# Patient Record
Sex: Female | Born: 1945 | Race: White | Hispanic: No | Marital: Married | Smoking: Never smoker
Health system: Southern US, Community
[De-identification: ages and names within clinical notes are randomized; demographics above are authoritative.]

## PROBLEM LIST (undated history)

## (undated) DIAGNOSIS — T753XXA Motion sickness, initial encounter: Secondary | ICD-10-CM

## (undated) DIAGNOSIS — R42 Dizziness and giddiness: Secondary | ICD-10-CM

## (undated) HISTORY — PX: WISDOM TOOTH EXTRACTION: SHX21

---

## 2018-04-06 LAB — HM HEPATITIS C SCREENING LAB: HM Hepatitis Screen: NEGATIVE

## 2019-10-12 ENCOUNTER — Telehealth: Payer: Self-pay | Admitting: Family Medicine

## 2019-10-12 NOTE — Telephone Encounter (Signed)
Patient's husband is calling to report that the patient had the Pfizer Vaccine 04/24/19, 05/15/19 Please advise CB- [phone]

## 2019-11-16 ENCOUNTER — Ambulatory Visit (INDEPENDENT_AMBULATORY_CARE_PROVIDER_SITE_OTHER): Payer: Medicare Other | Admitting: Family Medicine

## 2019-11-16 ENCOUNTER — Other Ambulatory Visit: Payer: Self-pay

## 2019-11-16 ENCOUNTER — Encounter: Payer: Self-pay | Admitting: Family Medicine

## 2019-11-16 VITALS — BP 136/72 | HR 65 | Temp 97.7°F | Resp 18 | Ht 62.5 in | Wt 151.8 lb

## 2019-11-16 DIAGNOSIS — Z1231 Encounter for screening mammogram for malignant neoplasm of breast: Secondary | ICD-10-CM | POA: Diagnosis not present

## 2019-11-16 DIAGNOSIS — Z1211 Encounter for screening for malignant neoplasm of colon: Secondary | ICD-10-CM | POA: Insufficient documentation

## 2019-11-16 DIAGNOSIS — E782 Mixed hyperlipidemia: Secondary | ICD-10-CM | POA: Insufficient documentation

## 2019-11-16 DIAGNOSIS — Z780 Asymptomatic menopausal state: Secondary | ICD-10-CM | POA: Insufficient documentation

## 2019-11-16 DIAGNOSIS — Z7689 Persons encountering health services in other specified circumstances: Secondary | ICD-10-CM | POA: Insufficient documentation

## 2019-11-16 DIAGNOSIS — Z23 Encounter for immunization: Secondary | ICD-10-CM | POA: Insufficient documentation

## 2019-11-16 DIAGNOSIS — Z13820 Encounter for screening for osteoporosis: Secondary | ICD-10-CM | POA: Insufficient documentation

## 2019-11-16 DIAGNOSIS — Z79899 Other long term (current) drug therapy: Secondary | ICD-10-CM

## 2019-11-16 DIAGNOSIS — Z0000 Encounter for general adult medical examination without abnormal findings: Secondary | ICD-10-CM

## 2019-11-16 MED ORDER — ATORVASTATIN CALCIUM 10 MG PO TABS: 10.0000 mg | ORAL_TABLET | Freq: Every day | ORAL | 3 refills | Status: DC

## 2019-11-16 NOTE — Assessment & Plan Note (Signed)
Pt postmenopausal w/ history of prior DEXA scan in 2017.  No report available for review.  Plan: 1. Obtain DG bone density.

## 2019-11-16 NOTE — Assessment & Plan Note (Signed)
See screening for osteoporosis A/P

## 2019-11-16 NOTE — Assessment & Plan Note (Signed)
Status unknown.  Recheck labs.  Continue meds without changes today.  Refills provided. Followup after labs.  

## 2019-11-16 NOTE — Assessment & Plan Note (Signed)
Pt last mammogram reported in 2017, no report available for review.  Plan: 1. Screening mammogram order placed.  Pt will call to schedule appointment.  Information given.

## 2019-11-16 NOTE — Patient Instructions (Signed)
Have your labs drawn and we will contact you with the results.  We have given you your influenza immunization today in clinic.  I have sent in a refill on your atorvastatin to your pharmacy on file.  For Mammogram screening for breast cancer and DEXA Scan (Bone mineral density) screening for osteoporosis  Call the Imaging Center below anytime to schedule your own appointment now that order has been placed.  [facility] [address] [place], [place] [address] Phone: [phone]  [facility] [address] [place], [place] [address] Phone: [phone]  The cologuard testing will be mailed to your home.  If you have any questions once you receive this, there should be a customer service phone number along with the testing that you can contact for questions/concerns/instructions.  We will plan to see you back in 6 months for annual physical  You will receive a survey after today's visit either digitally by e-mail or paper by [employer]. Your experiences and feedback matter to [place].  Please respond so we know how we are doing as we provide care for you.  Call us with any questions/concerns/needs.  It is my goal to be available to you for your health concerns.  Thanks for choosing me to be a partner in your healthcare needs!  Charlaine Dalton, FNP-C Family Nurse Practitioner [facility] Phone: [phone]

## 2019-11-16 NOTE — Progress Notes (Signed)
Subjective:    Patient ID: Rhonda Lamb, female    DOB: 10-Mar-1945, 74 y.o.   MRN: [id number]  Rhonda Lamb is a 74 y.o. female presenting on 11/16/2019 for Establish Care (pt recently located to the area. Pt requesting a refill on Atorvastatin)   HPI  Previous PCP was at [facility].  Records will not be requested, as are available through Care Everywhere.  Past medical, family, and surgical history reviewed w/ pt.  Has acute concerns today for needing medication refills, would like to have baseline labs drawn, needing screening for mammogram, bone density and colon cancer screening.  Depression screen PHQ 2/9 11/16/2019  Decreased Interest 0  Down, Depressed, Hopeless 0  PHQ - 2 Score 0    Social History   Tobacco Use  . Smoking status: Never Smoker  . Smokeless tobacco: Never Used  Vaping Use  . Vaping Use: Never used  Substance Use Topics  . Alcohol use: Never  . Drug use: Never    Review of Systems  Constitutional: Negative.   HENT: Negative.   Eyes: Negative.   Respiratory: Negative.   Cardiovascular: Negative.   Gastrointestinal: Negative.   Endocrine: Negative.   Genitourinary: Negative.   Musculoskeletal: Negative.   Skin: Negative.   Allergic/Immunologic: Negative.   Neurological: Negative.   Hematological: Negative.   Psychiatric/Behavioral: Negative.    Per HPI unless specifically indicated above     Objective:    BP 136/72 (BP Location: Left Arm, Patient Position: Sitting, Cuff Size: Normal)   Pulse 65   Temp 97.7 F (36.5 C) (Oral)   Resp 18   Ht 5' 2.5" (1.588 m)   Wt 151 lb 12.8 oz (68.9 kg)   SpO2 100%   BMI 27.32 kg/m   Wt Readings from Last 3 Encounters:  11/16/19 151 lb 12.8 oz (68.9 kg)    Physical Exam Vitals reviewed.  Constitutional:      General: She is not in acute distress.    Appearance: Normal appearance. She is well-developed, well-groomed and overweight. She is not ill-appearing or toxic-appearing.  HENT:     Head:  Normocephalic and atraumatic.     Nose:     Comments: Lesia Sago is in place, covering mouth and nose. Eyes:     General: Lids are normal. Vision grossly intact.        Right eye: No discharge.        Left eye: No discharge.     Extraocular Movements: Extraocular movements intact.     Conjunctiva/sclera: Conjunctivae normal.     Pupils: Pupils are equal, round, and reactive to light.  Cardiovascular:     Rate and Rhythm: Normal rate and regular rhythm.     Pulses: Normal pulses.     Heart sounds: Normal heart sounds. No murmur heard.  No friction rub. No gallop.   Pulmonary:     Effort: Pulmonary effort is normal. No respiratory distress.     Breath sounds: Normal breath sounds.  Skin:    General: Skin is warm and dry.     Capillary Refill: Capillary refill takes less than 2 seconds.  Neurological:     General: No focal deficit present.     Mental Status: She is alert and oriented to person, place, and time.  Psychiatric:        Attention and Perception: Attention and perception normal.        Mood and Affect: Mood and affect normal.        Speech:  Speech normal.        Behavior: Behavior normal. Behavior is cooperative.        Thought Content: Thought content normal.        Cognition and Memory: Cognition and memory normal.        Judgment: Judgment normal.    Results for orders placed or performed in visit on 11/16/19  HM HEPATITIS C SCREENING LAB  Result Value Ref Range   HM Hepatitis Screen Negative-Validated       Assessment & Plan:   Problem List Items Addressed This Visit      Other   Encounter to establish care with new doctor - Primary    New patient establishment to [facility] for primary care services.  Interested in having labs drawn today for baseline at our clinic along with discussion regarding preventative care screenings.      Post-menopausal    See screening for osteoporosis A/P      Relevant Orders   DG Bone Density   Mixed hyperlipidemia    Status  unknown.  Recheck labs.  Continue meds without changes today.  Refills provided. Followup after labs.       Relevant Medications   atorvastatin (LIPITOR) 10 MG tablet   Other Relevant Orders   CBC with Differential   COMPLETE METABOLIC PANEL WITH GFR   Lipid Profile   Screening for osteoporosis    Pt postmenopausal w/ history of prior DEXA scan in 2017.  No report available for review.  Plan: 1. Obtain DG bone density.         Relevant Orders   DG Bone Density   Encounter for screening mammogram for malignant neoplasm of breast    Pt last mammogram reported in 2017, no report available for review.  Plan: 1. Screening mammogram order placed.  Pt will call to schedule appointment.  Information given.       Relevant Orders   MM 3D SCREEN BREAST BILATERAL   Colon cancer screening    Pt requiring colon cancer screening.  Denies family history of colon cancer.  Reports most recent cologuard screening was completed in 2018 and was negative.  Plan: - Discussed timing for initiation of colon cancer screening ACS vs USPSTF guidelines - Mutual decision making discussion for options of colonoscopy vs cologuard.  Pt prefers cologuard. - Ordered Cologuard today      Relevant Orders   Cologuard   Needs flu shot    Pt > age 59.  Needs annual influenza vaccine.  VIS provided.  Plan: 1. Administer high dose fluzone today.       Relevant Orders   Flu Vaccine QUAD High Dose(Fluad)    Other Visit Diagnoses    Long-term use of high-risk medication       Relevant Orders   DG Bone Density   CBC with Differential   COMPLETE METABOLIC PANEL WITH GFR   TSH + free T4   Routine medical exam          Meds ordered this encounter  Medications  . atorvastatin (LIPITOR) 10 MG tablet    Sig: Take 1 tablet (10 mg total) by mouth daily.    Dispense:  90 tablet    Refill:  3   Follow up plan: Return in about 6 months (around 05/15/2020) for CPE.   Charlaine Dalton, FNP Family  Nurse Practitioner [facility] 11/16/2019, 12:00 PM

## 2019-11-16 NOTE — Assessment & Plan Note (Signed)
Pt > age 74.  Needs annual influenza vaccine.  VIS provided.  Plan: 1. Administer high dose fluzone today.  

## 2019-11-16 NOTE — Assessment & Plan Note (Signed)
Pt requiring colon cancer screening.  Denies family history of colon cancer.  Reports most recent cologuard screening was completed in 2018 and was negative.  Plan: - Discussed timing for initiation of colon cancer screening ACS vs USPSTF guidelines - Mutual decision making discussion for options of colonoscopy vs cologuard.  Pt prefers cologuard. - Ordered Cologuard today

## 2019-11-16 NOTE — Assessment & Plan Note (Signed)
New patient establishment to [facility] for primary care services.  Interested in having labs drawn today for baseline at our clinic along with discussion regarding preventative care screenings.

## 2019-11-17 LAB — LIPID PANEL
Cholesterol: 155 mg/dL (ref <200)
HDL: 70 mg/dL (ref >=50)
LDL Cholesterol (Calc): 66 mg/dL (calc)
Non-HDL Cholesterol (Calc): 85 mg/dL (calc) (ref <130)
Total CHOL/HDL Ratio: 2.2 (calc) (ref <5.0)
Triglycerides: 104 mg/dL (ref <150)

## 2019-11-17 LAB — COMPLETE METABOLIC PANEL WITH GFR
AG Ratio: 1.8 (calc) (ref 1.0–2.5)
ALT: 11 U/L (ref 6–29)
AST: 20 U/L (ref 10–35)
Albumin: 4.4 g/dL (ref 3.6–5.1)
Alkaline phosphatase (APISO): 66 U/L (ref 37–153)
BUN: 11 mg/dL (ref 7–25)
CO2: 26 mmol/L (ref 20–32)
Calcium: 8.8 mg/dL (ref 8.6–10.4)
Chloride: 104 mmol/L (ref 98–110)
Creat: 0.82 mg/dL (ref 0.60–0.93)
GFR, Est African American: 83 mL/min/{1.73_m2} (ref >=60)
GFR, Est Non African American: 72 mL/min/{1.73_m2} (ref >=60)
Globulin: 2.4 g/dL (calc) (ref 1.9–3.7)
Glucose, Bld: 80 mg/dL (ref 65–99)
Potassium: 4.4 mmol/L (ref 3.5–5.3)
Sodium: 139 mmol/L (ref 135–146)
Total Bilirubin: 0.9 mg/dL (ref 0.2–1.2)
Total Protein: 6.8 g/dL (ref 6.1–8.1)

## 2019-11-17 LAB — CBC WITH DIFFERENTIAL/PLATELET
Absolute Monocytes: 301 cells/uL (ref 200–950)
Basophils Absolute: 41 cells/uL (ref 0–200)
Basophils Relative: 0.8 %
Eosinophils Absolute: 112 cells/uL (ref 15–500)
Eosinophils Relative: 2.2 %
HCT: 45.7 % — ABNORMAL HIGH (ref 35.0–45.0)
Hemoglobin: 15.2 g/dL (ref 11.7–15.5)
Lymphs Abs: 2091 cells/uL (ref 850–3900)
MCH: 30.5 pg (ref 27.0–33.0)
MCHC: 33.3 g/dL (ref 32.0–36.0)
MCV: 91.6 fL (ref 80.0–100.0)
MPV: 11.6 fL (ref 7.5–12.5)
Monocytes Relative: 5.9 %
Neutro Abs: 2555 cells/uL (ref 1500–7800)
Neutrophils Relative %: 50.1 %
Platelets: 180 10*3/uL (ref 140–400)
RBC: 4.99 10*6/uL (ref 3.80–5.10)
RDW: 12.2 % (ref 11.0–15.0)
Total Lymphocyte: 41 %
WBC: 5.1 10*3/uL (ref 3.8–10.8)

## 2019-11-17 LAB — TSH+FREE T4: TSH W/REFLEX TO FT4: 1.45 mIU/L (ref 0.40–4.50)

## 2019-12-07 ENCOUNTER — Ambulatory Visit
Admission: RE | Admit: 2019-12-07 | Discharge: 2019-12-07 | Disposition: A | Discharge status: Home / Self Care | Payer: Medicare Other | Source: Ambulatory Visit | Attending: Family Medicine | Admitting: Family Medicine

## 2019-12-07 ENCOUNTER — Other Ambulatory Visit: Payer: Self-pay

## 2019-12-07 ENCOUNTER — Encounter: Payer: Self-pay | Admitting: Family Medicine

## 2019-12-07 ENCOUNTER — Ambulatory Visit (INDEPENDENT_AMBULATORY_CARE_PROVIDER_SITE_OTHER): Payer: Medicare Other | Admitting: Pharmacist

## 2019-12-07 ENCOUNTER — Other Ambulatory Visit: Payer: Self-pay | Admitting: Family Medicine

## 2019-12-07 DIAGNOSIS — M810 Age-related osteoporosis without current pathological fracture: Secondary | ICD-10-CM | POA: Insufficient documentation

## 2019-12-07 DIAGNOSIS — Z780 Asymptomatic menopausal state: Secondary | ICD-10-CM | POA: Insufficient documentation

## 2019-12-07 DIAGNOSIS — Z1231 Encounter for screening mammogram for malignant neoplasm of breast: Secondary | ICD-10-CM | POA: Insufficient documentation

## 2019-12-07 DIAGNOSIS — M816 Localized osteoporosis [Lequesne]: Secondary | ICD-10-CM

## 2019-12-07 DIAGNOSIS — Z7251 High risk heterosexual behavior: Secondary | ICD-10-CM

## 2019-12-07 DIAGNOSIS — Z79899 Other long term (current) drug therapy: Secondary | ICD-10-CM | POA: Diagnosis present

## 2019-12-07 DIAGNOSIS — Z13820 Encounter for screening for osteoporosis: Secondary | ICD-10-CM | POA: Insufficient documentation

## 2019-12-07 MED ORDER — ALENDRONATE SODIUM 70 MG PO TABS: 70.0000 mg | ORAL_TABLET | ORAL | 11 refills | Status: DC

## 2019-12-07 NOTE — Progress Notes (Signed)
Met with patient today. Her husband is newly diagnosed with HIV. Explained PrEP and our PrEP services. She is not interested right now and she states they are not sexually active.  Gave her  Information and told her to call me if she changes her mind.

## 2019-12-08 ENCOUNTER — Other Ambulatory Visit: Payer: Self-pay | Admitting: Family Medicine

## 2019-12-08 ENCOUNTER — Telehealth: Payer: Self-pay

## 2019-12-08 ENCOUNTER — Telehealth: Payer: Self-pay | Admitting: *Deleted

## 2019-12-08 DIAGNOSIS — Z1211 Encounter for screening for malignant neoplasm of colon: Secondary | ICD-10-CM

## 2019-12-08 LAB — HIV ANTIBODY (ROUTINE TESTING W REFLEX): HIV 1&2 Ab, 4th Generation: NONREACTIVE

## 2019-12-08 NOTE — Telephone Encounter (Signed)
Resent in cologuard order

## 2019-12-08 NOTE — Telephone Encounter (Signed)
Copied from [place] [id number]. Topic: General - Other >> Dec 08, 2019  1:21 PM Mcneil, Ja-Kwan wrote: Reason for CRM: Pt stated she still has not received the cologuard kit that was suppose to have been sent to her home.

## 2019-12-08 NOTE — Telephone Encounter (Signed)
RN left message about test results, asked Gentry to call back. Andree Coss, RN

## 2019-12-11 ENCOUNTER — Other Ambulatory Visit: Payer: Self-pay

## 2019-12-11 DIAGNOSIS — E782 Mixed hyperlipidemia: Secondary | ICD-10-CM

## 2019-12-11 MED ORDER — ATORVASTATIN CALCIUM 10 MG PO TABS: 10.0000 mg | ORAL_TABLET | Freq: Every day | ORAL | 3 refills | Status: DC

## 2019-12-13 ENCOUNTER — Other Ambulatory Visit: Payer: Self-pay

## 2020-01-03 LAB — COLOGUARD

## 2020-03-16 ENCOUNTER — Other Ambulatory Visit: Payer: Medicare Other

## 2020-03-16 ENCOUNTER — Other Ambulatory Visit: Payer: Self-pay

## 2020-03-16 DIAGNOSIS — Z20822 Contact with and (suspected) exposure to covid-19: Secondary | ICD-10-CM

## 2020-03-19 LAB — NOVEL CORONAVIRUS, NAA: SARS-CoV-2, NAA: NOT DETECTED

## 2020-03-19 LAB — SPECIMEN STATUS REPORT

## 2020-05-07 LAB — COLOGUARD: Cologuard: POSITIVE — AB

## 2020-05-15 LAB — COLOGUARD
COLOGUARD: POSITIVE — AB
Cologuard: POSITIVE — AB

## 2020-05-16 ENCOUNTER — Telehealth: Payer: Self-pay | Admitting: *Deleted

## 2020-05-16 DIAGNOSIS — R195 Other fecal abnormalities: Secondary | ICD-10-CM

## 2020-05-16 NOTE — Telephone Encounter (Signed)
Positive Cologuard results per Tresa Endo at [facility]. Case # [id number] Routing to pcp.

## 2020-05-16 NOTE — Telephone Encounter (Signed)
Attempted to contact the patient, no answer. LMOM to return my call. ?

## 2020-05-16 NOTE — Telephone Encounter (Signed)
Please notify patient that positive cologuard now will need a referral to GI for Colonoscopy.  Cologuard cannot confirm colon cancer, it just means that there was an abnormal result, and may still be a polyp or a pre cancer  Referral is in for  GI and they can call her to setup next step in screening with colonoscopy  Saralyn Pilar, DO [facility] 05/16/2020, 4:20 PM

## 2020-05-17 NOTE — Telephone Encounter (Addendum)
The pt was notified of her cologuard results. She verbalize understanding, no questions or concerns.

## 2020-05-21 ENCOUNTER — Other Ambulatory Visit: Payer: Self-pay

## 2020-05-21 ENCOUNTER — Encounter: Payer: Self-pay | Admitting: Unknown Physician Specialty

## 2020-05-21 ENCOUNTER — Ambulatory Visit (INDEPENDENT_AMBULATORY_CARE_PROVIDER_SITE_OTHER): Payer: Medicare Other | Admitting: Unknown Physician Specialty

## 2020-05-21 VITALS — BP 162/79 | HR 66 | Temp 97.8°F | Ht <= 58 in | Wt 159.0 lb

## 2020-05-21 DIAGNOSIS — I10 Essential (primary) hypertension: Secondary | ICD-10-CM | POA: Diagnosis not present

## 2020-05-21 DIAGNOSIS — Z636 Dependent relative needing care at home: Secondary | ICD-10-CM

## 2020-05-21 DIAGNOSIS — E782 Mixed hyperlipidemia: Secondary | ICD-10-CM

## 2020-05-21 DIAGNOSIS — R195 Other fecal abnormalities: Secondary | ICD-10-CM | POA: Diagnosis not present

## 2020-05-21 DIAGNOSIS — M810 Age-related osteoporosis without current pathological fracture: Secondary | ICD-10-CM

## 2020-05-21 DIAGNOSIS — E663 Overweight: Secondary | ICD-10-CM

## 2020-05-21 MED ORDER — IBANDRONATE SODIUM 150 MG PO TABS: 150.0000 mg | ORAL_TABLET | ORAL | 3 refills | Status: DC

## 2020-05-21 NOTE — Assessment & Plan Note (Signed)
Walking 10 minutes twice a day and discussed mediterranean diet

## 2020-05-21 NOTE — Assessment & Plan Note (Signed)
Discussed counseling.  She had done this before and will reach out to her counselor

## 2020-05-21 NOTE — Assessment & Plan Note (Signed)
Changed to monthly [place].  Discussed instructions on use

## 2020-05-21 NOTE — Patient Instructions (Addendum)
Hypertension, Adult High blood pressure (hypertension) is when the force of blood pumping through the arteries is too strong. The arteries are the blood vessels that carry blood from the heart throughout the body. Hypertension forces the heart to work harder to pump blood and may cause arteries to become narrow or stiff. Untreated or uncontrolled hypertension can cause a heart attack, heart failure, a stroke, kidney disease, and other problems. A blood pressure reading consists of a higher number over a lower number. Ideally, your blood pressure should be below 120/80. The first ("top") number is called the systolic pressure. It is a measure of the pressure in your arteries as your heart beats. The second ("bottom") number is called the diastolic pressure. It is a measure of the pressure in your arteries as the heart relaxes. What are the causes? The exact cause of this condition is not known. There are some conditions that result in or are related to high blood pressure. What increases the risk? Some risk factors for high blood pressure are under your control. The following factors may make you more likely to develop this condition:  Smoking.  Having type 2 diabetes mellitus, high cholesterol, or both.  Not getting enough exercise or physical activity.  Being overweight.  Having too much fat, sugar, calories, or salt (sodium) in your diet.  Drinking too much alcohol. Some risk factors for high blood pressure may be difficult or impossible to change. Some of these factors include:  Having chronic kidney disease.  Having a family history of high blood pressure.  Age. Risk increases with age.  Race. You may be at higher risk if you are African American.  Gender. Men are at higher risk than women before age 45. After age 65, women are at higher risk than men.  Having obstructive sleep apnea.  Stress. What are the signs or symptoms? High blood pressure may not cause symptoms. Very high  blood pressure (hypertensive crisis) may cause:  Headache.  Anxiety.  Shortness of breath.  Nosebleed.  Nausea and vomiting.  Vision changes.  Severe chest pain.  Seizures. How is this diagnosed? This condition is diagnosed by measuring your blood pressure while you are seated, with your arm resting on a flat surface, your legs uncrossed, and your feet flat on the floor. The cuff of the blood pressure monitor will be placed directly against the skin of your upper arm at the level of your heart. It should be measured at least twice using the same arm. Certain conditions can cause a difference in blood pressure between your right and left arms. Certain factors can cause blood pressure readings to be lower or higher than normal for a short period of time:  When your blood pressure is higher when you are in a health care provider's office than when you are at home, this is called white coat hypertension. Most people with this condition do not need medicines.  When your blood pressure is higher at home than when you are in a health care provider's office, this is called masked hypertension. Most people with this condition may need medicines to control blood pressure. If you have a high blood pressure reading during one visit or you have normal blood pressure with other risk factors, you may be asked to:  Return on a different day to have your blood pressure checked again.  Monitor your blood pressure at home for 1 week or longer. If you are diagnosed with hypertension, you may have other blood or   imaging tests to help your health care provider understand your overall risk for other conditions. How is this treated? This condition is treated by making healthy lifestyle changes, such as eating healthy foods, exercising more, and reducing your alcohol intake. Your health care provider may prescribe medicine if lifestyle changes are not enough to get your blood pressure under control, and  if:  Your systolic blood pressure is above 130.  Your diastolic blood pressure is above 80. Your personal target blood pressure may vary depending on your medical conditions, your age, and other factors. Follow these instructions at home: Eating and drinking  Eat a diet that is high in fiber and potassium, and low in sodium, added sugar, and fat. An example eating plan is called the DASH (Dietary Approaches to Stop Hypertension) diet. To eat this way: ? Eat plenty of fresh fruits and vegetables. Try to fill one half of your plate at each meal with fruits and vegetables. ? Eat whole grains, such as whole-wheat pasta, brown rice, or whole-grain bread. Fill about one fourth of your plate with whole grains. ? Eat or drink low-fat dairy products, such as skim milk or low-fat yogurt. ? Avoid fatty cuts of meat, processed or cured meats, and poultry with skin. Fill about one fourth of your plate with lean proteins, such as fish, chicken without skin, beans, eggs, or tofu. ? Avoid pre-made and processed foods. These tend to be higher in sodium, added sugar, and fat.  Reduce your daily sodium intake. Most people with hypertension should eat less than 1,500 mg of sodium a day.  Do not drink alcohol if: ? Your health care provider tells you not to drink. ? You are pregnant, may be pregnant, or are planning to become pregnant.  If you drink alcohol: ? Limit how much you use to:  0-1 drink a day for women.  0-2 drinks a day for men. ? Be aware of how much alcohol is in your drink. In the [place] one drink equals one 12 oz bottle of beer (355 mL), one 5 oz glass of wine (148 mL), or one 1 oz glass of hard liquor (44 mL).   Lifestyle  Work with your health care provider to maintain a healthy body weight or to lose weight. Ask what an ideal weight is for you.  Get at least 30 minutes of exercise most days of the week. Activities may include walking, swimming, or biking.  Include exercise to  strengthen your muscles (resistance exercise), such as Pilates or lifting weights, as part of your weekly exercise routine. Try to do these types of exercises for 30 minutes at least 3 days a week.  Do not use any products that contain nicotine or tobacco, such as cigarettes, e-cigarettes, and chewing tobacco. If you need help quitting, ask your health care provider.  Monitor your blood pressure at home as told by your health care provider.  Keep all follow-up visits as told by your health care provider. This is important.   Medicines  Take over-the-counter and prescription medicines only as told by your health care provider. Follow directions carefully. Blood pressure medicines must be taken as prescribed.  Do not skip doses of blood pressure medicine. Doing this puts you at risk for problems and can make the medicine less effective.  Ask your health care provider about side effects or reactions to medicines that you should watch for. Contact a health care provider if you:  Think you are having a reaction to a   medicine you are taking.  Have headaches that keep coming back (recurring).  Feel dizzy.  Have swelling in your ankles.  Have trouble with your vision. Get help right away if you:  Develop a severe headache or confusion.  Have unusual weakness or numbness.  Feel faint.  Have severe pain in your chest or abdomen.  Vomit repeatedly.  Have trouble breathing. Summary  Hypertension is when the force of blood pumping through your arteries is too strong. If this condition is not controlled, it may put you at risk for serious complications.  Your personal target blood pressure may vary depending on your medical conditions, your age, and other factors. For most people, a normal blood pressure is less than 120/80.  Hypertension is treated with lifestyle changes, medicines, or a combination of both. Lifestyle changes include losing weight, eating a healthy, low-sodium diet,  exercising more, and limiting alcohol. This information is not intended to replace advice given to you by your health care provider. Make sure you discuss any questions you have with your health care provider. Document Revised: 10/27/2017 Document Reviewed: 10/27/2017 Elsevier Patient Education  2021 [employer]  Mediterranean Diet A Mediterranean diet refers to food and lifestyle choices that are based on the traditions of countries located on the [place]. This way of eating has been shown to help prevent certain conditions and improve outcomes for people who have chronic diseases, like kidney disease and heart disease. What are tips for following this plan? Lifestyle  Cook and eat meals together with your family, when possible.  Drink enough fluid to keep your urine clear or pale yellow.  Be physically active every day. This includes: ? Aerobic exercise like running or swimming. ? Leisure activities like gardening, walking, or housework.  Get 7-8 hours of sleep each night.  If recommended by your health care provider, drink red wine in moderation. This means 1 glass a day for nonpregnant women and 2 glasses a day for men. A glass of wine equals 5 oz (150 mL). Reading food labels  Check the serving size of packaged foods. For foods such as rice and pasta, the serving size refers to the amount of cooked product, not dry.  Check the total fat in packaged foods. Avoid foods that have saturated fat or trans fats.  Check the ingredients list for added sugars, such as corn syrup.   Shopping  At the grocery store, buy most of your food from the areas near the walls of the store. This includes: ? Fresh fruits and vegetables (produce). ? Grains, beans, nuts, and seeds. Some of these may be available in unpackaged forms or large amounts (in bulk). ? Fresh seafood. ? Poultry and eggs. ? Low-fat dairy products.  Buy whole ingredients instead of prepackaged foods.  Buy fresh  fruits and vegetables in-season from local farmers markets.  Buy frozen fruits and vegetables in resealable bags.  If you do not have access to quality fresh seafood, buy precooked frozen shrimp or canned fish, such as tuna, salmon, or sardines.  Buy small amounts of raw or cooked vegetables, salads, or olives from the deli or salad bar at your store.  Stock your pantry so you always have certain foods on hand, such as olive oil, canned tuna, canned tomatoes, rice, pasta, and beans. Cooking  Cook foods with extra-virgin olive oil instead of using butter or other vegetable oils.  Have meat as a side dish, and have vegetables or grains as your main dish. This means  having meat in small portions or adding small amounts of meat to foods like pasta or stew.  Use beans or vegetables instead of meat in common dishes like chili or lasagna.  Experiment with different cooking methods. Try roasting or broiling vegetables instead of steaming or sauteing them.  Add frozen vegetables to soups, stews, pasta, or rice.  Add nuts or seeds for added healthy fat at each meal. You can add these to yogurt, salads, or vegetable dishes.  Marinate fish or vegetables using olive oil, lemon juice, garlic, and fresh herbs. Meal planning  Plan to eat 1 vegetarian meal one day each week. Try to work up to 2 vegetarian meals, if possible.  Eat seafood 2 or more times a week.  Have healthy snacks readily available, such as: ? Vegetable sticks with hummus. ? [place] yogurt. ? Fruit and nut trail mix.  Eat balanced meals throughout the week. This includes: ? Fruit: 2-3 servings a day ? Vegetables: 4-5 servings a day ? Low-fat dairy: 2 servings a day ? Fish, poultry, or lean meat: 1 serving a day ? Beans and legumes: 2 or more servings a week ? Nuts and seeds: 1-2 servings a day ? Whole grains: 6-8 servings a day ? Extra-virgin olive oil: 3-4 servings a day  Limit red meat and sweets to only a few servings  a month   What are my food choices?  Mediterranean diet ? Recommended  Grains: Whole-grain pasta. Brown rice. Bulgar wheat. Polenta. Couscous. Whole-wheat bread. Orpah Cobb.  Vegetables: Artichokes. Beets. Broccoli. Cabbage. Carrots. Eggplant. Green beans. Chard. Kale. Spinach. Onions. Leeks. Peas. Squash. Tomatoes. Peppers. Radishes.  Fruits: Apples. Apricots. Avocado. Berries. Bananas. Cherries. Dates. Figs. Grapes. Lemons. Melon. Oranges. Peaches. Plums. Pomegranate.  Meats and other protein foods: Beans. Almonds. Sunflower seeds. Pine nuts. Peanuts. Cod. Salmon. Scallops. Shrimp. Tuna. Tilapia. Clams. Oysters. Eggs.  Dairy: Low-fat milk. Cheese. Greek yogurt.  Beverages: Water. Red wine. Herbal tea.  Fats and oils: Extra virgin olive oil. Avocado oil. Grape seed oil.  Sweets and desserts: [place] yogurt with honey. Baked apples. Poached pears. Trail mix.  Seasoning and other foods: Basil. Cilantro. Coriander. Cumin. Mint. Parsley. Sage. Rosemary. Tarragon. Garlic. Oregano. Thyme. Pepper. Balsalmic vinegar. Tahini. Hummus. Tomato sauce. Olives. Mushrooms. ? Limit these  Grains: Prepackaged pasta or rice dishes. Prepackaged cereal with added sugar.  Vegetables: Deep fried potatoes (french fries).  Fruits: Fruit canned in syrup.  Meats and other protein foods: Beef. Pork. Lamb. Poultry with skin. Hot dogs. Tomasa Blase.  Dairy: Ice cream. Sour cream. Whole milk.  Beverages: Juice. Sugar-sweetened soft drinks. Beer. Liquor and spirits.  Fats and oils: Butter. Canola oil. Vegetable oil. Beef fat (tallow). Lard.  Sweets and desserts: Cookies. Cakes. Pies. Candy.  Seasoning and other foods: Mayonnaise. Premade sauces and marinades. The items listed may not be a complete list. Talk with your dietitian about what dietary choices are right for you. Summary  The Mediterranean diet includes both food and lifestyle choices.  Eat a variety of fresh fruits and vegetables, beans, nuts,  seeds, and whole grains.  Limit the amount of red meat and sweets that you eat.  Talk with your health care provider about whether it is safe for you to drink red wine in moderation. This means 1 glass a day for nonpregnant women and 2 glasses a day for men. A glass of wine equals 5 oz (150 mL). This information is not intended to replace advice given to you by your health care provider. Make  sure you discuss any questions you have with your health care provider. Document Revised: 10/17/2015 Document Reviewed: 10/10/2015 Elsevier Patient Education  2020 [employer].  Mediterranean Diet A Mediterranean diet refers to food and lifestyle choices that are based on the traditions of countries located on the [place]. This way of eating has been shown to help prevent certain conditions and improve outcomes for people who have chronic diseases, like kidney disease and heart disease. What are tips for following this plan? Lifestyle  Cook and eat meals together with your family, when possible.  Drink enough fluid to keep your urine clear or pale yellow.  Be physically active every day. This includes: ? Aerobic exercise like running or swimming. ? Leisure activities like gardening, walking, or housework.  Get 7-8 hours of sleep each night.  If recommended by your health care provider, drink red wine in moderation. This means 1 glass a day for nonpregnant women and 2 glasses a day for men. A glass of wine equals 5 oz (150 mL). Reading food labels  Check the serving size of packaged foods. For foods such as rice and pasta, the serving size refers to the amount of cooked product, not dry.  Check the total fat in packaged foods. Avoid foods that have saturated fat or trans fats.  Check the ingredients list for added sugars, such as corn syrup.   Shopping  At the grocery store, buy most of your food from the areas near the walls of the store. This includes: ? Fresh fruits and  vegetables (produce). ? Grains, beans, nuts, and seeds. Some of these may be available in unpackaged forms or large amounts (in bulk). ? Fresh seafood. ? Poultry and eggs. ? Low-fat dairy products.  Buy whole ingredients instead of prepackaged foods.  Buy fresh fruits and vegetables in-season from local farmers markets.  Buy frozen fruits and vegetables in resealable bags.  If you do not have access to quality fresh seafood, buy precooked frozen shrimp or canned fish, such as tuna, salmon, or sardines.  Buy small amounts of raw or cooked vegetables, salads, or olives from the deli or salad bar at your store.  Stock your pantry so you always have certain foods on hand, such as olive oil, canned tuna, canned tomatoes, rice, pasta, and beans. Cooking  Cook foods with extra-virgin olive oil instead of using butter or other vegetable oils.  Have meat as a side dish, and have vegetables or grains as your main dish. This means having meat in small portions or adding small amounts of meat to foods like pasta or stew.  Use beans or vegetables instead of meat in common dishes like chili or lasagna.  Experiment with different cooking methods. Try roasting or broiling vegetables instead of steaming or sauteing them.  Add frozen vegetables to soups, stews, pasta, or rice.  Add nuts or seeds for added healthy fat at each meal. You can add these to yogurt, salads, or vegetable dishes.  Marinate fish or vegetables using olive oil, lemon juice, garlic, and fresh herbs. Meal planning  Plan to eat 1 vegetarian meal one day each week. Try to work up to 2 vegetarian meals, if possible.  Eat seafood 2 or more times a week.  Have healthy snacks readily available, such as: ? Vegetable sticks with hummus. ? [place] yogurt. ? Fruit and nut trail mix.  Eat balanced meals throughout the week. This includes: ? Fruit: 2-3 servings a day ? Vegetables: 4-5 servings a day ?  Low-fat dairy: 2 servings a  day ? Fish, poultry, or lean meat: 1 serving a day ? Beans and legumes: 2 or more servings a week ? Nuts and seeds: 1-2 servings a day ? Whole grains: 6-8 servings a day ? Extra-virgin olive oil: 3-4 servings a day  Limit red meat and sweets to only a few servings a month   What are my food choices?  Mediterranean diet ? Recommended  Grains: Whole-grain pasta. Brown rice. Bulgar wheat. Polenta. Couscous. Whole-wheat bread. Orpah Cobb.  Vegetables: Artichokes. Beets. Broccoli. Cabbage. Carrots. Eggplant. Green beans. Chard. Kale. Spinach. Onions. Leeks. Peas. Squash. Tomatoes. Peppers. Radishes.  Fruits: Apples. Apricots. Avocado. Berries. Bananas. Cherries. Dates. Figs. Grapes. Lemons. Melon. Oranges. Peaches. Plums. Pomegranate.  Meats and other protein foods: Beans. Almonds. Sunflower seeds. Pine nuts. Peanuts. Cod. Salmon. Scallops. Shrimp. Tuna. Tilapia. Clams. Oysters. Eggs.  Dairy: Low-fat milk. Cheese. Greek yogurt.  Beverages: Water. Red wine. Herbal tea.  Fats and oils: Extra virgin olive oil. Avocado oil. Grape seed oil.  Sweets and desserts: [place] yogurt with honey. Baked apples. Poached pears. Trail mix.  Seasoning and other foods: Basil. Cilantro. Coriander. Cumin. Mint. Parsley. Sage. Rosemary. Tarragon. Garlic. Oregano. Thyme. Pepper. Balsalmic vinegar. Tahini. Hummus. Tomato sauce. Olives. Mushrooms. ? Limit these  Grains: Prepackaged pasta or rice dishes. Prepackaged cereal with added sugar.  Vegetables: Deep fried potatoes (french fries).  Fruits: Fruit canned in syrup.  Meats and other protein foods: Beef. Pork. Lamb. Poultry with skin. Hot dogs. Tomasa Blase.  Dairy: Ice cream. Sour cream. Whole milk.  Beverages: Juice. Sugar-sweetened soft drinks. Beer. Liquor and spirits.  Fats and oils: Butter. Canola oil. Vegetable oil. Beef fat (tallow). Lard.  Sweets and desserts: Cookies. Cakes. Pies. Candy.  Seasoning and other foods: Mayonnaise. Premade  sauces and marinades. The items listed may not be a complete list. Talk with your dietitian about what dietary choices are right for you. Summary  The Mediterranean diet includes both food and lifestyle choices.  Eat a variety of fresh fruits and vegetables, beans, nuts, seeds, and whole grains.  Limit the amount of red meat and sweets that you eat.  Talk with your health care provider about whether it is safe for you to drink red wine in moderation. This means 1 glass a day for nonpregnant women and 2 glasses a day for men. A glass of wine equals 5 oz (150 mL). This information is not intended to replace advice given to you by your health care provider. Make sure you discuss any questions you have with your health care provider. Document Revised: 10/17/2015 Document Reviewed: 10/10/2015 Elsevier Patient Education  2020 [employer].  Mediterranean Diet A Mediterranean diet refers to food and lifestyle choices that are based on the traditions of countries located on the [place]. This way of eating has been shown to help prevent certain conditions and improve outcomes for people who have chronic diseases, like kidney disease and heart disease. What are tips for following this plan? Lifestyle  Cook and eat meals together with your family, when possible.  Drink enough fluid to keep your urine clear or pale yellow.  Be physically active every day. This includes: ? Aerobic exercise like running or swimming. ? Leisure activities like gardening, walking, or housework.  Get 7-8 hours of sleep each night.  If recommended by your health care provider, drink red wine in moderation. This means 1 glass a day for nonpregnant women and 2 glasses a day for men. A glass of  wine equals 5 oz (150 mL). Reading food labels  Check the serving size of packaged foods. For foods such as rice and pasta, the serving size refers to the amount of cooked product, not dry.  Check the total fat in  packaged foods. Avoid foods that have saturated fat or trans fats.  Check the ingredients list for added sugars, such as corn syrup.   Shopping  At the grocery store, buy most of your food from the areas near the walls of the store. This includes: ? Fresh fruits and vegetables (produce). ? Grains, beans, nuts, and seeds. Some of these may be available in unpackaged forms or large amounts (in bulk). ? Fresh seafood. ? Poultry and eggs. ? Low-fat dairy products.  Buy whole ingredients instead of prepackaged foods.  Buy fresh fruits and vegetables in-season from local farmers markets.  Buy frozen fruits and vegetables in resealable bags.  If you do not have access to quality fresh seafood, buy precooked frozen shrimp or canned fish, such as tuna, salmon, or sardines.  Buy small amounts of raw or cooked vegetables, salads, or olives from the deli or salad bar at your store.  Stock your pantry so you always have certain foods on hand, such as olive oil, canned tuna, canned tomatoes, rice, pasta, and beans. Cooking  Cook foods with extra-virgin olive oil instead of using butter or other vegetable oils.  Have meat as a side dish, and have vegetables or grains as your main dish. This means having meat in small portions or adding small amounts of meat to foods like pasta or stew.  Use beans or vegetables instead of meat in common dishes like chili or lasagna.  Experiment with different cooking methods. Try roasting or broiling vegetables instead of steaming or sauteing them.  Add frozen vegetables to soups, stews, pasta, or rice.  Add nuts or seeds for added healthy fat at each meal. You can add these to yogurt, salads, or vegetable dishes.  Marinate fish or vegetables using olive oil, lemon juice, garlic, and fresh herbs. Meal planning  Plan to eat 1 vegetarian meal one day each week. Try to work up to 2 vegetarian meals, if possible.  Eat seafood 2 or more times a week.  Have  healthy snacks readily available, such as: ? Vegetable sticks with hummus. ? [place] yogurt. ? Fruit and nut trail mix.  Eat balanced meals throughout the week. This includes: ? Fruit: 2-3 servings a day ? Vegetables: 4-5 servings a day ? Low-fat dairy: 2 servings a day ? Fish, poultry, or lean meat: 1 serving a day ? Beans and legumes: 2 or more servings a week ? Nuts and seeds: 1-2 servings a day ? Whole grains: 6-8 servings a day ? Extra-virgin olive oil: 3-4 servings a day  Limit red meat and sweets to only a few servings a month   What are my food choices?  Mediterranean diet ? Recommended  Grains: Whole-grain pasta. Brown rice. [place] wheat. Polenta. Couscous. Whole-wheat bread. Orpah Cobb.  Vegetables: Artichokes. Beets. Broccoli. Cabbage. Carrots. Eggplant. Green beans. Chard. Kale. Spinach. Onions. Leeks. Peas. Squash. Tomatoes. Peppers. Radishes.  Fruits: Apples. Apricots. Avocado. Berries. Bananas. Cherries. Dates. Figs. Grapes. Lemons. Melon. Oranges. Peaches. Plums. Pomegranate.  Meats and other protein foods: Beans. Almonds. Sunflower seeds. Pine nuts. Peanuts. Cod. Salmon. Scallops. Shrimp. Tuna. Tilapia. Clams. Oysters. Eggs.  Dairy: Low-fat milk. Cheese. Greek yogurt.  Beverages: Water. Red wine. Herbal tea.  Fats and oils: Extra virgin olive oil. Avocado  oil. Grape seed oil.  Sweets and desserts: [place] yogurt with honey. Baked apples. Poached pears. Trail mix.  Seasoning and other foods: Basil. Cilantro. Coriander. Cumin. Mint. Parsley. Sage. Rosemary. Tarragon. Garlic. Oregano. Thyme. Pepper. Balsalmic vinegar. Tahini. Hummus. Tomato sauce. Olives. Mushrooms. ? Limit these  Grains: Prepackaged pasta or rice dishes. Prepackaged cereal with added sugar.  Vegetables: Deep fried potatoes (french fries).  Fruits: Fruit canned in syrup.  Meats and other protein foods: Beef. Pork. Lamb. Poultry with skin. Hot dogs. Tomasa Blase.  Dairy: Ice cream. Sour cream.  Whole milk.  Beverages: Juice. Sugar-sweetened soft drinks. Beer. Liquor and spirits.  Fats and oils: Butter. Canola oil. Vegetable oil. Beef fat (tallow). Lard.  Sweets and desserts: Cookies. Cakes. Pies. Candy.  Seasoning and other foods: Mayonnaise. Premade sauces and marinades. The items listed may not be a complete list. Talk with your dietitian about what dietary choices are right for you. Summary  The Mediterranean diet includes both food and lifestyle choices.  Eat a variety of fresh fruits and vegetables, beans, nuts, seeds, and whole grains.  Limit the amount of red meat and sweets that you eat.  Talk with your health care provider about whether it is safe for you to drink red wine in moderation. This means 1 glass a day for nonpregnant women and 2 glasses a day for men. A glass of wine equals 5 oz (150 mL). This information is not intended to replace advice given to you by your health care provider. Make sure you discuss any questions you have with your health care provider. Document Revised: 10/17/2015 Document Reviewed: 10/10/2015 Elsevier Patient Education  2020 [employer].  Mediterranean Diet A Mediterranean diet refers to food and lifestyle choices that are based on the traditions of countries located on the [employer] [place]. This way of eating has been shown to help prevent certain conditions and improve outcomes for people who have chronic diseases, like kidney disease and heart disease. What are tips for following this plan? Lifestyle  Cook and eat meals together with your family, when possible.  Drink enough fluid to keep your urine clear or pale yellow.  Be physically active every day. This includes: ? Aerobic exercise like running or swimming. ? Leisure activities like gardening, walking, or housework.  Get 7-8 hours of sleep each night.  If recommended by your health care provider, drink red wine in moderation. This means 1 glass a day for nonpregnant  women and 2 glasses a day for men. A glass of wine equals 5 oz (150 mL). Reading food labels  Check the serving size of packaged foods. For foods such as rice and pasta, the serving size refers to the amount of cooked product, not dry.  Check the total fat in packaged foods. Avoid foods that have saturated fat or trans fats.  Check the ingredients list for added sugars, such as corn syrup.   Shopping  At the grocery store, buy most of your food from the areas near the walls of the store. This includes: ? Fresh fruits and vegetables (produce). ? Grains, beans, nuts, and seeds. Some of these may be available in unpackaged forms or large amounts (in bulk). ? Fresh seafood. ? Poultry and eggs. ? Low-fat dairy products.  Buy whole ingredients instead of prepackaged foods.  Buy fresh fruits and vegetables in-season from local farmers markets.  Buy frozen fruits and vegetables in resealable bags.  If you do not have access to quality fresh seafood, buy precooked frozen shrimp or  canned fish, such as tuna, salmon, or sardines.  Buy small amounts of raw or cooked vegetables, salads, or olives from the deli or salad bar at your store.  Stock your pantry so you always have certain foods on hand, such as olive oil, canned tuna, canned tomatoes, rice, pasta, and beans. Cooking  Cook foods with extra-virgin olive oil instead of using butter or other vegetable oils.  Have meat as a side dish, and have vegetables or grains as your main dish. This means having meat in small portions or adding small amounts of meat to foods like pasta or stew.  Use beans or vegetables instead of meat in common dishes like chili or lasagna.  Experiment with different cooking methods. Try roasting or broiling vegetables instead of steaming or sauteing them.  Add frozen vegetables to soups, stews, pasta, or rice.  Add nuts or seeds for added healthy fat at each meal. You can add these to yogurt, salads, or  vegetable dishes.  Marinate fish or vegetables using olive oil, lemon juice, garlic, and fresh herbs. Meal planning  Plan to eat 1 vegetarian meal one day each week. Try to work up to 2 vegetarian meals, if possible.  Eat seafood 2 or more times a week.  Have healthy snacks readily available, such as: ? Vegetable sticks with hummus. ? [place] yogurt. ? Fruit and nut trail mix.  Eat balanced meals throughout the week. This includes: ? Fruit: 2-3 servings a day ? Vegetables: 4-5 servings a day ? Low-fat dairy: 2 servings a day ? Fish, poultry, or lean meat: 1 serving a day ? Beans and legumes: 2 or more servings a week ? Nuts and seeds: 1-2 servings a day ? Whole grains: 6-8 servings a day ? Extra-virgin olive oil: 3-4 servings a day  Limit red meat and sweets to only a few servings a month   What are my food choices?  Mediterranean diet ? Recommended  Grains: Whole-grain pasta. Brown rice. Bulgar wheat. Polenta. Couscous. Whole-wheat bread. [place] Cobbatmeal. Quinoa.  Vegetables: Artichokes. Beets. Broccoli. Cabbage. Carrots. Eggplant. Green beans. Chard. Kale. Spinach. Onions. Leeks. Peas. Squash. Tomatoes. Peppers. Radishes.  Fruits: Apples. Apricots. Avocado. Berries. Bananas. Cherries. Dates. Figs. Grapes. Lemons. Melon. Oranges. Peaches. Plums. Pomegranate.  Meats and other protein foods: Beans. Almonds. Sunflower seeds. Pine nuts. Peanuts. Cod. Salmon. Scallops. Shrimp. Tuna. Tilapia. Clams. Oysters. Eggs.  Dairy: Low-fat milk. Cheese. Greek yogurt.  Beverages: Water. Red wine. Herbal tea.  Fats and oils: Extra virgin olive oil. Avocado oil. Grape seed oil.  Sweets and desserts: [place] yogurt with honey. Baked apples. Poached pears. Trail mix.  Seasoning and other foods: Basil. Cilantro. Coriander. Cumin. Mint. Parsley. Sage. Rosemary. Tarragon. Garlic. Oregano. Thyme. Pepper. Balsalmic vinegar. Tahini. Hummus. Tomato sauce. Olives. Mushrooms. ? Limit these  Grains:  Prepackaged pasta or rice dishes. Prepackaged cereal with added sugar.  Vegetables: Deep fried potatoes (french fries).  Fruits: Fruit canned in syrup.  Meats and other protein foods: Beef. Pork. Lamb. Poultry with skin. Hot dogs. Tomasa BlaseBacon.  Dairy: Ice cream. Sour cream. Whole milk.  Beverages: Juice. Sugar-sweetened soft drinks. Beer. Liquor and spirits.  Fats and oils: Butter. Canola oil. Vegetable oil. Beef fat (tallow). Lard.  Sweets and desserts: Cookies. Cakes. Pies. Candy.  Seasoning and other foods: Mayonnaise. Premade sauces and marinades. The items listed may not be a complete list. Talk with your dietitian about what dietary choices are right for you. Summary  The Mediterranean diet includes both food and lifestyle choices.  Eat  a variety of fresh fruits and vegetables, beans, nuts, seeds, and whole grains.  Limit the amount of red meat and sweets that you eat.  Talk with your health care provider about whether it is safe for you to drink red wine in moderation. This means 1 glass a day for nonpregnant women and 2 glasses a day for men. A glass of wine equals 5 oz (150 mL). This information is not intended to replace advice given to you by your health care provider. Make sure you discuss any questions you have with your health care provider. Document Revised: 10/17/2015 Document Reviewed: 10/10/2015 Elsevier Patient Education  2020 Elsevier Inc.  Goal BP 150/90

## 2020-05-21 NOTE — Assessment & Plan Note (Signed)
Continue Atorvastatin.  Check labs today

## 2020-05-21 NOTE — Progress Notes (Signed)
BP (!) 162/79 (BP Location: Right Arm, Patient Position: Sitting, Cuff Size: Normal)   Pulse 66   Temp 97.8 F (36.6 C) (Temporal)   Ht 4' (1.219 m)   Wt 159 lb (72.1 kg)   SpO2 100%   BMI 48.52 kg/m    Subjective:    Patient ID: Rhonda Lamb, female    DOB: Oct 29, 1945, 75 y.o.   MRN: [id number]  HPI: Rhonda Lamb is a 75 y.o. female  Chief Complaint  Patient presents with  . Annual Exam    Pt has questions regarding her medication Alendronate Sodium for bone density makes her get stomach pain and would like a vaccine instead. Pt would like to be instructed on a eating plan.   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  He is being treated in [place].  She has been tested and not sexually intimate with her husband.  She admits to fatigue.  Noted BP is a little high today.    Depression screen PHQ 2/9 11/16/2019  Decreased Interest 0  Down, Depressed, Hopeless 0  PHQ - 2 Score 0   Osteoporosis Not tolerating Fosamax.  Willing to look at an alternative.     Family History  Problem Relation Age of Onset  . Breast cancer Neg Hx    History reviewed. No pertinent past medical history.  Social History   Socioeconomic History  . Marital status: Married    Spouse name: Not on file  . Number of children: Not on file  . Years of education: Not on file  . Highest education level: Not on file  Occupational History  . Not on file  Tobacco Use  . Smoking status: Never Smoker  . Smokeless tobacco: Never Used  Vaping Use  . Vaping Use: Never used  Substance and Sexual Activity  . Alcohol use: Never  . Drug use: Never  . Sexual activity: Not on file  Other Topics Concern  . Not on file  Social History Narrative  . Not on file   Social Determinants of Health   Financial Resource Strain: Not on file  Food Insecurity: Not on file  Transportation Needs: Not on file  Physical  Activity: Not on file  Stress: Not on file  Social Connections: Not on file  Intimate Partner Violence: Not on file     Relevant past medical, surgical, family and social history reviewed and updated as indicated. Interim medical history since our last visit reviewed. Allergies and medications reviewed and updated.  Review of Systems  Constitutional: Positive for fatigue.  HENT: Negative.   Eyes: Negative.   Respiratory: Negative.   Cardiovascular: Negative.   Gastrointestinal: Negative.   Genitourinary: Negative.   Musculoskeletal: Negative.   Skin: Negative.   Allergic/Immunologic: Negative.   Neurological: Negative.   Hematological: Negative.   Psychiatric/Behavioral: Negative.        Admits to some mental exhaustion    Per HPI unless specifically indicated above     Objective:    BP (!) 162/79 (BP Location: Right Arm, Patient Position: Sitting, Cuff Size: Normal)   Pulse 66   Temp 97.8 F (36.6 C) (Temporal)   Ht 4' (1.219 m)   Wt 159 lb (72.1 kg)   SpO2 100%   BMI 48.52 kg/m   Wt Readings from Last 3 Encounters:  05/21/20 159 lb (72.1 kg)  11/16/19 151 lb 12.8 oz (68.9 kg)  Physical Exam Constitutional:      General: She is not in acute distress.    Appearance: Normal appearance. She is well-developed.  HENT:     Head: Normocephalic and atraumatic.  Eyes:     General: Lids are normal. No scleral icterus.       Right eye: No discharge.        Left eye: No discharge.     Conjunctiva/sclera: Conjunctivae normal.  Neck:     Vascular: No carotid bruit or JVD.  Cardiovascular:     Rate and Rhythm: Normal rate and regular rhythm.     Heart sounds: Normal heart sounds.  Pulmonary:     Effort: Pulmonary effort is normal.     Breath sounds: Normal breath sounds.  Abdominal:     Palpations: There is no hepatomegaly or splenomegaly.  Musculoskeletal:        General: Normal range of motion.     Cervical back: Normal range of motion and neck supple.   Lymphadenopathy:     Cervical: No cervical adenopathy.  Skin:    General: Skin is warm and dry.     Coloration: Skin is not pale.     Findings: No rash.  Neurological:     Mental Status: She is alert and oriented to person, place, and time.  Psychiatric:        Behavior: Behavior normal.        Thought Content: Thought content normal.        Judgment: Judgment normal.       Assessment & Plan:   Problem List Items Addressed This Visit      Unprioritized   Caregiver stress    Discussed counseling.  She had done this before and will reach out to her counselor      Mixed hyperlipidemia - Primary    Continue Atorvastatin.  Check labs today      Relevant Orders   Lipid panel   Osteoporosis    Changed to monthly Boniva.  Discussed instructions on use      Relevant Medications   ibandronate (BONIVA) 150 MG tablet   Other Relevant Orders   Vitamin D (25 hydroxy)   Overweight    Walking 10 minutes twice a day and discussed mediterranean diet       Other Visit Diagnoses    Hypertension, unspecified type       High today.  Encouraged home monitoring.  Counseling.  Recheck 1 months   Relevant Orders   Lipid panel   CBC with Differential/Platelet   Comprehensive metabolic panel   TSH   Ambulatory referral to Gastroenterology   Positive colorectal cancer screening using Cologuard test       She said she was called about this.  I don't see the results.  Scheduled Colonoscopy       Follow up plan: Return in about 4 weeks (around 06/18/2020).

## 2020-05-22 ENCOUNTER — Encounter: Payer: Self-pay | Admitting: Unknown Physician Specialty

## 2020-05-22 ENCOUNTER — Encounter: Payer: Self-pay | Admitting: Family Medicine

## 2020-05-22 ENCOUNTER — Encounter: Payer: Medicare Other | Admitting: Family Medicine

## 2020-05-22 ENCOUNTER — Other Ambulatory Visit: Payer: Self-pay | Admitting: Unknown Physician Specialty

## 2020-05-22 LAB — COMPREHENSIVE METABOLIC PANEL
AG Ratio: 1.8 (calc) (ref 1.0–2.5)
ALT: 14 U/L (ref 6–29)
AST: 17 U/L (ref 10–35)
Albumin: 4.4 g/dL (ref 3.6–5.1)
Alkaline phosphatase (APISO): 71 U/L (ref 37–153)
BUN: 11 mg/dL (ref 7–25)
CO2: 28 mmol/L (ref 20–32)
Calcium: 8.8 mg/dL (ref 8.6–10.4)
Chloride: 105 mmol/L (ref 98–110)
Creat: 0.85 mg/dL (ref 0.60–0.93)
Globulin: 2.4 g/dL (calc) (ref 1.9–3.7)
Glucose, Bld: 87 mg/dL (ref 65–99)
Potassium: 4.2 mmol/L (ref 3.5–5.3)
Sodium: 139 mmol/L (ref 135–146)
Total Bilirubin: 0.8 mg/dL (ref 0.2–1.2)
Total Protein: 6.8 g/dL (ref 6.1–8.1)

## 2020-05-22 LAB — CBC WITH DIFFERENTIAL/PLATELET
Absolute Monocytes: 338 cells/uL (ref 200–950)
Basophils Absolute: 38 cells/uL (ref 0–200)
Basophils Relative: 0.8 %
Eosinophils Absolute: 108 cells/uL (ref 15–500)
Eosinophils Relative: 2.3 %
HCT: 46.5 % — ABNORMAL HIGH (ref 35.0–45.0)
Hemoglobin: 15.4 g/dL (ref 11.7–15.5)
Lymphs Abs: 1946 cells/uL (ref 850–3900)
MCH: 30.9 pg (ref 27.0–33.0)
MCHC: 33.1 g/dL (ref 32.0–36.0)
MCV: 93.2 fL (ref 80.0–100.0)
MPV: 11 fL (ref 7.5–12.5)
Monocytes Relative: 7.2 %
Neutro Abs: 2270 cells/uL (ref 1500–7800)
Neutrophils Relative %: 48.3 %
Platelets: 182 10*3/uL (ref 140–400)
RBC: 4.99 10*6/uL (ref 3.80–5.10)
RDW: 12.7 % (ref 11.0–15.0)
Total Lymphocyte: 41.4 %
WBC: 4.7 10*3/uL (ref 3.8–10.8)

## 2020-05-22 LAB — LIPID PANEL
Cholesterol: 167 mg/dL (ref <200)
HDL: 74 mg/dL (ref >=50)
LDL Cholesterol (Calc): 72 mg/dL (calc)
Non-HDL Cholesterol (Calc): 93 mg/dL (calc) (ref <130)
Total CHOL/HDL Ratio: 2.3 (calc) (ref <5.0)
Triglycerides: 128 mg/dL (ref <150)

## 2020-05-22 LAB — TSH: TSH: 1.87 mIU/L (ref 0.40–4.50)

## 2020-05-22 LAB — VITAMIN D 25 HYDROXY (VIT D DEFICIENCY, FRACTURES): Vit D, 25-Hydroxy: 11 ng/mL — ABNORMAL LOW (ref 30–100)

## 2020-05-22 MED ORDER — VITAMIN D (ERGOCALCIFEROL) 1.25 MG (50000 UNIT) PO CAPS: 50000.0000 [IU] | ORAL_CAPSULE | ORAL | 0 refills | Status: DC

## 2020-05-27 ENCOUNTER — Other Ambulatory Visit: Payer: Self-pay

## 2020-05-27 MED ORDER — IBANDRONATE SODIUM 150 MG PO TABS: 150.0000 mg | ORAL_TABLET | ORAL | 3 refills | Status: DC

## 2020-05-28 ENCOUNTER — Telehealth: Payer: Self-pay

## 2020-05-28 NOTE — Telephone Encounter (Signed)
The pt was notified that the prescription was fixed and resubmitted to [facility].

## 2020-05-28 NOTE — Telephone Encounter (Signed)
Copied from CRM [id number]. Topic: General - Other >> May 28, 2020 11:18 AM Randol Kern wrote: Reason for CRM: Pt called reporting that Rhonda Lamb cannot send prescription until they receive authorization/correspondence for recent refill (Boniva)

## 2020-05-29 ENCOUNTER — Telehealth (INDEPENDENT_AMBULATORY_CARE_PROVIDER_SITE_OTHER): Payer: Self-pay | Admitting: Gastroenterology

## 2020-05-29 ENCOUNTER — Other Ambulatory Visit: Payer: Self-pay

## 2020-05-29 DIAGNOSIS — R195 Other fecal abnormalities: Secondary | ICD-10-CM

## 2020-05-29 MED ORDER — NA SULFATE-K SULFATE-MG SULF 17.5-3.13-1.6 GM/177ML PO SOLN: 1.0000 | Freq: Once | ORAL | 0 refills | Status: AC

## 2020-05-29 NOTE — Progress Notes (Signed)
Gastroenterology Pre-Procedure Review  Request Date: Thursday 06/06/20 Requesting Physician: Dr. Servando Snare  PATIENT REVIEW QUESTIONS: The patient responded to the following health history questions as indicated:    1. Are you having any GI issues? Pts referral stated positive cologuard.  She states she does experience some below navel pressure and discomfort from time to time 2. Do you have a personal history of Polyps? no 3. Do you have a family history of Colon Cancer or Polyps? no 4. Diabetes Mellitus? no 5. Joint replacements in the past 12 months?no 6. Major health problems in the past 3 months?no 7. Any artificial heart valves, MVP, or defibrillator?no    MEDICATIONS & ALLERGIES:    Patient reports the following regarding taking any anticoagulation/antiplatelet therapy:   Plavix, Coumadin, Eliquis, Xarelto, Lovenox, Pradaxa, Brilinta, or Effient? no Aspirin? no  Patient confirms/reports the following medications:  Current Outpatient Medications  Medication Sig Dispense Refill  . atorvastatin (LIPITOR) 10 MG tablet Take 1 tablet (10 mg total) by mouth daily. 90 tablet 3  . Vitamin D, Ergocalciferol, (DRISDOL) 1.25 MG (50000 UNIT) CAPS capsule Take 1 capsule (50,000 Units total) by mouth every 7 (seven) days. (Patient not taking: Reported on 05/29/2020) 12 capsule 0  . ibandronate (BONIVA) 150 MG tablet Take 1 tablet (150 mg total) by mouth every 30 (thirty) days. Take in the morning with a full glass of water, on an empty stomach, and do not take anything else by mouth or lie down for at least 1 hour. (Patient not taking: Reported on 05/29/2020) 12 tablet 3   No current facility-administered medications for this visit.    Patient confirms/reports the following allergies:  No Known Allergies  No orders of the defined types were placed in this encounter.   AUTHORIZATION INFORMATION Primary Insurance: 1D#: Group #:  Secondary Insurance: 1D#: Group #:  SCHEDULE  INFORMATION: Date: Thursday 06/06/20 Time: Location:[facility]

## 2020-05-30 ENCOUNTER — Other Ambulatory Visit: Payer: Self-pay

## 2020-05-30 ENCOUNTER — Encounter: Payer: Self-pay | Admitting: Gastroenterology

## 2020-06-04 ENCOUNTER — Other Ambulatory Visit: Payer: Self-pay

## 2020-06-04 ENCOUNTER — Other Ambulatory Visit
Admission: RE | Admit: 2020-06-04 | Discharge: 2020-06-04 | Disposition: A | Discharge status: Home / Self Care | Payer: Medicare Other | Source: Ambulatory Visit | Attending: Gastroenterology | Admitting: Gastroenterology

## 2020-06-04 DIAGNOSIS — Z01812 Encounter for preprocedural laboratory examination: Secondary | ICD-10-CM | POA: Insufficient documentation

## 2020-06-04 DIAGNOSIS — Z20822 Contact with and (suspected) exposure to covid-19: Secondary | ICD-10-CM | POA: Diagnosis not present

## 2020-06-04 LAB — SARS CORONAVIRUS 2 (TAT 6-24 HRS): SARS Coronavirus 2: NEGATIVE

## 2020-06-05 NOTE — Discharge Instructions (Signed)

## 2020-06-06 ENCOUNTER — Encounter: Payer: Self-pay | Admitting: Gastroenterology

## 2020-06-06 ENCOUNTER — Encounter
Admission: RE | Disposition: A | Discharge status: Home / Self Care | Payer: Self-pay | Source: Home / Self Care | Attending: Gastroenterology

## 2020-06-06 ENCOUNTER — Ambulatory Visit: Payer: Medicare Other | Admitting: Anesthesiology

## 2020-06-06 ENCOUNTER — Ambulatory Visit
Admission: RE | Admit: 2020-06-06 | Discharge: 2020-06-06 | Disposition: A | Discharge status: Home / Self Care | Payer: Medicare Other | Attending: Gastroenterology | Admitting: Gastroenterology

## 2020-06-06 ENCOUNTER — Other Ambulatory Visit: Payer: Self-pay

## 2020-06-06 DIAGNOSIS — Z79899 Other long term (current) drug therapy: Secondary | ICD-10-CM | POA: Insufficient documentation

## 2020-06-06 DIAGNOSIS — R195 Other fecal abnormalities: Secondary | ICD-10-CM

## 2020-06-06 DIAGNOSIS — K5730 Diverticulosis of large intestine without perforation or abscess without bleeding: Secondary | ICD-10-CM | POA: Insufficient documentation

## 2020-06-06 DIAGNOSIS — K640 First degree hemorrhoids: Secondary | ICD-10-CM | POA: Insufficient documentation

## 2020-06-06 HISTORY — PX: COLONOSCOPY WITH PROPOFOL: SHX5780

## 2020-06-06 HISTORY — DX: Motion sickness, initial encounter: T75.3XXA

## 2020-06-06 HISTORY — DX: Dizziness and giddiness: R42

## 2020-06-06 SURGERY — COLONOSCOPY WITH PROPOFOL
Anesthesia: General | Site: Rectum

## 2020-06-06 MED ORDER — PROPOFOL 10 MG/ML IV BOLUS
INTRAVENOUS | Status: DC | PRN
  Administered 2020-06-06: 30 mg via INTRAVENOUS
  Administered 2020-06-06 (×3): 50 mg via INTRAVENOUS

## 2020-06-06 MED ORDER — LIDOCAINE HCL (CARDIAC) PF 100 MG/5ML IV SOSY
PREFILLED_SYRINGE | INTRAVENOUS | Status: DC | PRN
  Administered 2020-06-06: 50 mg via INTRAVENOUS

## 2020-06-06 MED ORDER — LACTATED RINGERS IV SOLN: INTRAVENOUS | Status: DC

## 2020-06-06 MED ORDER — ONDANSETRON HCL 4 MG/2ML IJ SOLN
INTRAMUSCULAR | Status: DC | PRN
  Administered 2020-06-06: 4 mg via INTRAVENOUS

## 2020-06-06 MED ORDER — STERILE WATER FOR IRRIGATION IR SOLN: Status: DC | PRN

## 2020-06-06 MED ORDER — SODIUM CHLORIDE 0.9 % IV SOLN: INTRAVENOUS | Status: DC

## 2020-06-06 SURGICAL SUPPLY — 6 items
GOWN CVR UNV OPN BCK APRN NK (MISCELLANEOUS) ×2 IMPLANT
GOWN ISOL THUMB LOOP REG UNIV (MISCELLANEOUS) ×4
KIT PRC NS LF DISP ENDO (KITS) ×1 IMPLANT
KIT PROCEDURE OLYMPUS (KITS) ×2
MANIFOLD NEPTUNE II (INSTRUMENTS) ×2 IMPLANT
WATER STERILE IRR 250ML POUR (IV SOLUTION) ×2 IMPLANT

## 2020-06-06 NOTE — Anesthesia Postprocedure Evaluation (Signed)
Anesthesia Post Note  [name]  Procedure(s) Performed: COLONOSCOPY WITH PROPOFOL (N/A Rectum)     Patient location during evaluation: PACU Anesthesia Type: General Level of consciousness: awake and alert Pain management: pain level controlled Vital Signs Assessment: post-procedure vital signs reviewed and stable Respiratory status: spontaneous breathing and nonlabored ventilation Cardiovascular status: blood pressure returned to baseline Postop Assessment: no apparent nausea or vomiting Anesthetic complications: no   No complications documented.  Krina Mraz [employer]

## 2020-06-06 NOTE — Anesthesia Preprocedure Evaluation (Signed)
Anesthesia Evaluation  Patient identified by MRN, date of birth, ID band Patient awake    Reviewed: Allergy & Precautions, NPO status , Patient's Chart, lab work & pertinent test results  Airway Mallampati: II  TM Distance: >3 FB Neck ROM: Full    Dental no notable dental hx.    Pulmonary neg pulmonary ROS,    Pulmonary exam normal        Cardiovascular negative cardio ROS Normal cardiovascular exam     Neuro/Psych negative neurological ROS  negative psych ROS   GI/Hepatic negative GI ROS, Neg liver ROS,   Endo/Other  negative endocrine ROS  Renal/GU negative Renal ROS     Musculoskeletal negative musculoskeletal ROS (+)   Abdominal Normal abdominal exam  (+)   Peds  Hematology negative hematology ROS (+)   Anesthesia Other Findings   Reproductive/Obstetrics                             Anesthesia Physical Anesthesia Plan  ASA: II  Anesthesia Plan: General   Post-op Pain Management:    Induction: Intravenous  PONV Risk Score and Plan: 3 and Propofol infusion, Treatment may vary due to age or medical condition and TIVA  Airway Management Planned: Natural Airway  Additional Equipment: None  Intra-op Plan:   Post-operative Plan:   Informed Consent: I have reviewed the patients History and Physical, chart, labs and discussed the procedure including the risks, benefits and alternatives for the proposed anesthesia with the patient or authorized representative who has indicated his/her understanding and acceptance.     Dental advisory given  Plan Discussed with: CRNA  Anesthesia Plan Comments:         Anesthesia Quick Evaluation

## 2020-06-06 NOTE — H&P (Signed)
   Midge Minium, MD [facility] [address], [address] [place], [place] [address] Phone:[phone] Fax : [phone]  Primary Care Physician:  Tarri Fuller, FNP Primary Gastroenterologist:  Dr. Servando Snare  Pre-Procedure History & Physical: HPI:  Rhonda Lamb is a 75 y.o. female is here for an colonoscopy.   Past Medical History:  Diagnosis Date  . Motion sickness    circular motion  . Vertigo    none in over 1 year    Past Surgical History:  Procedure Laterality Date  . WISDOM TOOTH EXTRACTION      Prior to Admission medications   Medication Sig Start Date End Date Taking? Authorizing Provider  atorvastatin (LIPITOR) 10 MG tablet Take 1 tablet (10 mg total) by mouth daily. 12/11/19  Yes Malfi, Jodelle Gross, FNP  Vitamin D, Ergocalciferol, (DRISDOL) 1.25 MG (50000 UNIT) CAPS capsule Take 1 capsule (50,000 Units total) by mouth every 7 (seven) days. Patient not taking: Reported on 05/29/2020 05/22/20   Gabriel Cirri, NP  ibandronate (BONIVA) 150 MG tablet Take 1 tablet (150 mg total) by mouth every 30 (thirty) days. Take in the morning with a full glass of water, on an empty stomach, and do not take anything else by mouth or lie down for at least 1 hour. Patient not taking: Reported on 05/29/2020 05/27/20   Smitty Cords, DO    Allergies as of 05/29/2020  . (No Known Allergies)    Family History  Problem Relation Age of Onset  . Breast cancer Neg Hx     Social History   Socioeconomic History  . Marital status: Married    Spouse name: Not on file  . Number of children: Not on file  . Years of education: Not on file  . Highest education level: Not on file  Occupational History  . Not on file  Tobacco Use  . Smoking status: Never Smoker  . Smokeless tobacco: Never Used  Vaping Use  . Vaping Use: Never used  Substance and Sexual Activity  . Alcohol use: Never  . Drug use: Never  . Sexual activity: Not on file  Other Topics Concern  . Not on file  Social History  Narrative  . Not on file   Social Determinants of Health   Financial Resource Strain: Not on file  Food Insecurity: Not on file  Transportation Needs: Not on file  Physical Activity: Not on file  Stress: Not on file  Social Connections: Not on file  Intimate Partner Violence: Not on file    Review of Systems: See HPI, otherwise negative ROS  Physical Exam: BP (!) 161/87   Pulse 76   Temp (!) 97.1 F (36.2 C) (Temporal)   Resp 16   Ht 4' (1.219 m)   Wt 69.1 kg   SpO2 98%   BMI 46.48 kg/m  General:   Alert,  pleasant and cooperative in NAD Head:  Normocephalic and atraumatic. Neck:  Supple; no masses or thyromegaly. Lungs:  Clear throughout to auscultation.    Heart:  Regular rate and rhythm. Abdomen:  Soft, nontender and nondistended. Normal bowel sounds, without guarding, and without rebound.   Neurologic:  Alert and  oriented x4;  grossly normal neurologically.  Impression/Plan: Rhonda Lamb is here for an colonoscopy to be performed for positive cologurd  Risks, benefits, limitations, and alternatives regarding  colonoscopy have been reviewed with the patient.  Questions have been answered.  All parties agreeable.   Midge Minium, MD  06/06/2020, 9:06 AM

## 2020-06-06 NOTE — Op Note (Signed)
[facility] Gastroenterology Patient Name: Rhonda Lamb Procedure Date: 06/06/2020 9:08 AM MRN: [id number] Account #: [id number] Date of Birth: [birth year] Admit Type: Outpatient Age: 75 Room: [facility] OR ROOM 01 Gender: Female Note Status: Finalized Procedure:             Colonoscopy Indications:           Positive Cologuard test Providers:             Midge Minium MD, MD Referring MD:          Jodelle Gross. Malfi (Referring MD) Medicines:             Propofol per Anesthesia Complications:         No immediate complications. Procedure:             Pre-Anesthesia Assessment:                        - Prior to the procedure, a History and Physical was                         performed, and patient medications and allergies were                         reviewed. The patient's tolerance of previous                         anesthesia was also reviewed. The risks and benefits                         of the procedure and the sedation options and risks                         were discussed with the patient. All questions were                         answered, and informed consent was obtained. Prior                         Anticoagulants: The patient has taken no previous                         anticoagulant or antiplatelet agents. ASA Grade                         Assessment: II - A patient with mild systemic disease.                         After reviewing the risks and benefits, the patient                         was deemed in satisfactory condition to undergo the                         procedure.                        After obtaining informed consent, the colonoscope was  passed under direct vision. Throughout the procedure,                         the patient's blood pressure, pulse, and oxygen                         saturations were monitored continuously. The                         Colonoscope was introduced through the anus and                          advanced to the the cecum, identified by appendiceal                         orifice and ileocecal valve. The colonoscopy was                         performed without difficulty. The patient tolerated                         the procedure well. The quality of the bowel                         preparation was excellent. Findings:      The perianal and digital rectal examinations were normal.      Multiple small-mouthed diverticula were found in the sigmoid colon.      Non-bleeding internal hemorrhoids were found during retroflexion. The       hemorrhoids were Grade I (internal hemorrhoids that do not prolapse). Impression:            - Diverticulosis in the sigmoid colon.                        - Non-bleeding internal hemorrhoids.                        - No specimens collected. Recommendation:        - Discharge patient to home.                        - Resume previous diet.                        - Continue present medications.                        - Repeat colonoscopy is not recommended due to current                         age (33 years or older) for screening purposes. Procedure Code(s):     --- Professional ---                        [phone], Colonoscopy, flexible; diagnostic, including                         collection of specimen(s) by brushing or washing, when  performed (separate procedure) Diagnosis Code(s):     --- Professional ---                        R19.5, Other fecal abnormalities CPT copyright 2019 American Medical Association. All rights reserved. The codes documented in this report are preliminary and upon coder review may  be revised to meet current compliance requirements. Midge Minium MD, MD 06/06/2020 9:36:49 AM This report has been signed electronically. Number of Addenda: 0 Note Initiated On: 06/06/2020 9:08 AM Scope Withdrawal Time: 0 hours 7 minutes 3 seconds  Total Procedure Duration: 0 hours 17 minutes 10 seconds  Estimated  Blood Loss:  Estimated blood loss: none.      [facility]

## 2020-06-06 NOTE — Anesthesia Procedure Notes (Signed)
Procedure Name: MAC Date/Time: 06/06/2020 9:16 AM Performed by: Silvana Newness, CRNA Pre-anesthesia Checklist: Patient identified, Emergency Drugs available, Suction available, Patient being monitored and Timeout performed Patient Re-evaluated:Patient Re-evaluated prior to induction Oxygen Delivery Method: Nasal cannula Placement Confirmation: positive ETCO2

## 2020-06-06 NOTE — Transfer of Care (Signed)
Immediate Anesthesia Transfer of Care Note  [name]  Procedure(s) Performed: COLONOSCOPY WITH PROPOFOL (N/A Rectum)  Patient Location: PACU  Anesthesia Type: General  Level of Consciousness: awake, alert  and patient cooperative  Airway and Oxygen Therapy: Patient Spontanous Breathing and Patient connected to supplemental oxygen  Post-op Assessment: Post-op Vital signs reviewed, Patient's Cardiovascular Status Stable, Respiratory Function Stable, Patent Airway and No signs of Nausea or vomiting  Post-op Vital Signs: Reviewed and stable  Complications: No complications documented.

## 2020-06-07 ENCOUNTER — Encounter: Payer: Self-pay | Admitting: Gastroenterology

## 2020-06-18 ENCOUNTER — Ambulatory Visit (INDEPENDENT_AMBULATORY_CARE_PROVIDER_SITE_OTHER): Payer: Medicare Other | Admitting: Unknown Physician Specialty

## 2020-06-18 ENCOUNTER — Encounter: Payer: Self-pay | Admitting: Unknown Physician Specialty

## 2020-06-18 ENCOUNTER — Other Ambulatory Visit: Payer: Self-pay

## 2020-06-18 DIAGNOSIS — I10 Essential (primary) hypertension: Secondary | ICD-10-CM

## 2020-06-18 DIAGNOSIS — Z636 Dependent relative needing care at home: Secondary | ICD-10-CM

## 2020-06-18 NOTE — Assessment & Plan Note (Addendum)
2 elevated readings plus readings elevated at home.  She is unwilling to start on BP medications at this time but instead wants to consider working on life-style modifications.  We will recheck in 3 months.  Discussed goal BP, according to [facility] guidelines, is less than 150/90.  Written information given

## 2020-06-18 NOTE — Assessment & Plan Note (Signed)
Ongoing stress.  She has connected with counseling and is working to get something set up

## 2020-06-18 NOTE — Progress Notes (Addendum)
BP (!) 154/97 (BP Location: Left Arm, Patient Position: Sitting, Cuff Size: Normal)   Pulse 70   Temp (!) 97.5 F (36.4 C) (Temporal)   Resp 17   Ht 4' (1.219 m)   Wt 158 lb 6.4 oz (71.8 kg)   SpO2 100%   BMI 48.34 kg/m    Subjective:    Patient ID: Rhonda Lamb, female    DOB: 1945-11-26, 75 y.o.   MRN: [id number]  HPI: Rhonda Lamb is a 75 y.o. female  Chief Complaint  Patient presents with  . Weight Check    Pt currently making some diet changes to help with weight management   . Hypertension   Hypertension Checking at home but finds that the readings are about where they are now.  Feels that the caregiving stress has been a problem  No problems or lightheadedness No chest pain with exertion or shortness of breath - sometimes when lying on side she feels some pressure in chest.  No swelling or edema  She is working on getting counseling for caregiving stress.     Relevant past medical, surgical, family and social history reviewed and updated as indicated. Interim medical history since our last visit reviewed. Allergies and medications reviewed and updated.  Review of Systems  Per HPI unless specifically indicated above     Objective:    BP (!) 154/97 (BP Location: Left Arm, Patient Position: Sitting, Cuff Size: Normal)   Pulse 70   Temp (!) 97.5 F (36.4 C) (Temporal)   Resp 17   Ht 4' (1.219 m)   Wt 158 lb 6.4 oz (71.8 kg)   SpO2 100%   BMI 48.34 kg/m   Wt Readings from Last 3 Encounters:  06/18/20 158 lb 6.4 oz (71.8 kg)  06/06/20 152 lb 4.8 oz (69.1 kg)  05/21/20 159 lb (72.1 kg)    Physical Exam Constitutional:      General: She is not in acute distress.    Appearance: Normal appearance. She is well-developed.  HENT:     Head: Normocephalic and atraumatic.  Eyes:     General: Lids are normal. No scleral icterus.       Right eye: No discharge.        Left eye: No discharge.     Conjunctiva/sclera: Conjunctivae normal.  Neck:     Vascular: No  carotid bruit or JVD.  Cardiovascular:     Rate and Rhythm: Normal rate and regular rhythm.     Heart sounds: Normal heart sounds.  Pulmonary:     Effort: Pulmonary effort is normal.     Breath sounds: Normal breath sounds.  Abdominal:     Palpations: There is no hepatomegaly or splenomegaly.  Musculoskeletal:        General: Normal range of motion.     Cervical back: Normal range of motion and neck supple.  Skin:    General: Skin is warm and dry.     Coloration: Skin is not pale.     Findings: No rash.  Neurological:     Mental Status: She is alert and oriented to person, place, and time.  Psychiatric:        Behavior: Behavior normal.        Thought Content: Thought content normal.        Judgment: Judgment normal.    EKG NSR.  No STTW changes  Results for orders placed or performed during the hospital encounter of 06/04/20  SARS CORONAVIRUS 2 (TAT 6-24 HRS)  Nasopharyngeal Nasopharyngeal Swab   Specimen: Nasopharyngeal Swab  Result Value Ref Range   SARS Coronavirus 2 NEGATIVE NEGATIVE      Assessment & Plan:   Problem List Items Addressed This Visit      Unprioritized   Caregiver stress    Ongoing stress.  She has connected with counseling and is working to get something set up      Essential hypertension    2 elevated readings plus readings elevated at home.  She is unwilling to start on BP medications at this time but instead wants to consider working on life-style modifications.  We will recheck in 3 months.  Discussed goal BP, according to [facility] guidelines, is less than 150/90.  Written information given          Follow up plan: Return in about 3 months (around 09/17/2020).

## 2020-06-18 NOTE — Patient Instructions (Signed)
Goal BP is 150/90

## 2020-08-25 ENCOUNTER — Other Ambulatory Visit: Payer: Self-pay | Admitting: Unknown Physician Specialty

## 2020-08-25 NOTE — Telephone Encounter (Signed)
Requested medication (s) are due for refill today: yes  Requested medication (s) are on the active medication list: yes  Last refill:  05/22/20  Future visit scheduled: yes  Notes to clinic:  the RF not delegated to NT    Requested Prescriptions  Pending Prescriptions Disp Refills   Vitamin D, Ergocalciferol, (DRISDOL) 1.25 MG (50000 UNIT) CAPS capsule [Pharmacy Med Name: VITAMIN D2 50,000IU (ERGO) CAP RX] 12 capsule 0    Sig: TAKE 1 CAPSULE BY MOUTH EVERY 7 DAYS      Endocrinology:  Vitamins - Vitamin D Supplementation Failed - 08/25/2020  8:07 AM      Failed - 50,000 IU strengths are not delegated      Failed - Phosphate in normal range and within 360 days    No results found for: PHOS        Failed - Vitamin D in normal range and within 360 days    Vit D, 25-Hydroxy  Date Value Ref Range Status  05/21/2020 11 (L) 30 - 100 ng/mL Final    Comment:    Vitamin D Status         25-OH Vitamin D: . Deficiency:                    <20 ng/mL Insufficiency:             20 - 29 ng/mL Optimal:                 > or = 30 ng/mL . For 25-OH Vitamin D testing on patients on  D2-supplementation and patients for whom quantitation  of D2 and D3 fractions is required, the QuestAssureD(TM) 25-OH VIT D, (D2,D3), LC/MS/MS is recommended: order  code 93235 (patients >56yrs). See Note 1 . Note 1 . For additional information, please refer to  http://education.QuestDiagnostics.com/faq/FAQ199  (This link is being provided for informational/ educational purposes only.)           Passed - Ca in normal range and within 360 days    Calcium  Date Value Ref Range Status  05/21/2020 8.8 8.6 - 10.4 mg/dL Final          Passed - Valid encounter within last 12 months    Recent Outpatient Visits           2 months ago Essential hypertension   [facility] Gabriel Cirri, NP   3 months ago Mixed hyperlipidemia   [facility] Gabriel Cirri, NP   9 months ago  Encounter to establish care with new doctor   [facility], Jodelle Gross, FNP       Future Appointments             In 1 month Baity, Salvadore Oxford, NP [facility], [facility]

## 2020-08-27 ENCOUNTER — Other Ambulatory Visit: Payer: Self-pay

## 2020-08-27 ENCOUNTER — Telehealth: Payer: Self-pay

## 2020-08-27 NOTE — Telephone Encounter (Signed)
 /  Notes to clinic:  Patient has upcoming appt on 09/24/2020 Patient is not sure if she is suppose to continue this medication   / Requested Prescriptions  Pending Prescriptions Disp Refills   Vitamin D, Ergocalciferol, (DRISDOL) 1.25 MG (50000 UNIT) CAPS capsule 12 capsule 0    Sig: Take 1 capsule (50,000 Units total) by mouth every 7 (seven) days.      Endocrinology:  Vitamins - Vitamin D Supplementation Failed - 08/27/2020  9:09 AM      Failed - 50,000 IU strengths are not delegated      Failed - Phosphate in normal range and within 360 days    No results found for: PHOS        Failed - Vitamin D in normal range and within 360 days    Vit D, 25-Hydroxy  Date Value Ref Range Status  05/21/2020 11 (L) 30 - 100 ng/mL Final    Comment:    Vitamin D Status         25-OH Vitamin D: . Deficiency:                    <20 ng/mL Insufficiency:             20 - 29 ng/mL Optimal:                 > or = 30 ng/mL . For 25-OH Vitamin D testing on patients on  D2-supplementation and patients for whom quantitation  of D2 and D3 fractions is required, the QuestAssureD(TM) 25-OH VIT D, (D2,D3), LC/MS/MS is recommended: order  code 16109 (patients >36yrs). See Note 1 . Note 1 . For additional information, please refer to  http://education.QuestDiagnostics.com/faq/FAQ199  (This link is being provided for informational/ educational purposes only.)           Passed - Ca in normal range and within 360 days    Calcium  Date Value Ref Range Status  05/21/2020 8.8 8.6 - 10.4 mg/dL Final          Passed - Valid encounter within last 12 months    Recent Outpatient Visits           2 months ago Essential hypertension   [facility] Gabriel Cirri, NP   3 months ago Mixed hyperlipidemia   [facility] Gabriel Cirri, NP   9 months ago Encounter to establish care with new doctor   [facility], Jodelle Gross, FNP       Future  Appointments             In 4 weeks Sampson Si, Salvadore Oxford, NP [facility], [facility]

## 2020-08-27 NOTE — Telephone Encounter (Signed)
Error note not needed

## 2020-09-24 ENCOUNTER — Other Ambulatory Visit: Payer: Self-pay

## 2020-09-24 ENCOUNTER — Ambulatory Visit (INDEPENDENT_AMBULATORY_CARE_PROVIDER_SITE_OTHER): Payer: Medicare Other | Admitting: Internal Medicine

## 2020-09-24 ENCOUNTER — Encounter: Payer: Self-pay | Admitting: Internal Medicine

## 2020-09-24 VITALS — BP 137/81 | HR 66 | Temp 97.3°F | Resp 18 | Ht 62.0 in | Wt 154.8 lb

## 2020-09-24 DIAGNOSIS — E782 Mixed hyperlipidemia: Secondary | ICD-10-CM | POA: Diagnosis not present

## 2020-09-24 DIAGNOSIS — Z6828 Body mass index (BMI) 28.0-28.9, adult: Secondary | ICD-10-CM

## 2020-09-24 DIAGNOSIS — M810 Age-related osteoporosis without current pathological fracture: Secondary | ICD-10-CM | POA: Diagnosis not present

## 2020-09-24 DIAGNOSIS — E559 Vitamin D deficiency, unspecified: Secondary | ICD-10-CM | POA: Insufficient documentation

## 2020-09-24 DIAGNOSIS — I10 Essential (primary) hypertension: Secondary | ICD-10-CM | POA: Diagnosis not present

## 2020-09-24 DIAGNOSIS — E663 Overweight: Secondary | ICD-10-CM | POA: Insufficient documentation

## 2020-09-24 NOTE — Assessment & Plan Note (Signed)
Vitamin D level today

## 2020-09-24 NOTE — Assessment & Plan Note (Signed)
Encourage diet and exercise weight loss 

## 2020-09-24 NOTE — Patient Instructions (Signed)
Vitamin D Deficiency Vitamin D deficiency is when your body does not have enough vitamin D. Vitamin D is important to your body because: It helps your body use other minerals. It helps to keep your bones strong and healthy. It may help to prevent some diseases. It helps your heart and other muscles work well. Not getting enough vitamin D can make your bones soft. It can also cause other health problems. What are the causes? This condition may be caused by: Not eating enough foods that contain vitamin D. Not getting enough sun. Having diseases that make it hard for your body to absorb vitamin D. Having a surgery in which a part of the stomach or a part of the small intestine is removed. Having kidney disease or liver disease. What increases the risk? You are more likely to get this condition if: You are older. You do not spend much time outdoors. You live in a nursing home. You have had broken bones. You have weak or thin bones (osteoporosis). You have a disease or condition that changes how your body absorbs vitamin D. You have dark skin. You take certain medicines. You are overweight or obese. What are the signs or symptoms? In mild cases, there may not be any symptoms. If the condition is very bad, symptoms may include: Bone pain. Muscle pain. Falling often. Broken bones caused by a minor injury. How is this treated? Treatment may include taking supplements as told by your doctor. Your doctor will tell you what dose is best for you. Supplements may include: Vitamin D. Calcium. Follow these instructions at home: Eating and drinking  Eat foods that contain vitamin D, such as: Dairy products, cereals, or juices with added vitamin D. Check the label. Fish, such as salmon or trout. Eggs. Oysters. Mushrooms. The items listed above may not be a complete list of what you can eat and drink. Contact a dietitian for more options. General instructions Take medicines and  supplements only as told by your doctor. Get regular, safe exposure to natural sunlight. Do not use a tanning bed. Maintain a healthy weight. Lose weight if needed. Keep all follow-up visits as told by your doctor. This is important. How is this prevented? You can get vitamin D by: Eating foods that naturally contain vitamin D. Eating or drinking products that have vitamin D added to them, such as cereals, juices, and milk. Taking vitamin D or a multivitamin that contains vitamin D. Being in the sun. Your body makes vitamin D when your skin is exposed to sunlight. Your body changes the sunlight into a form of the vitamin that it can use. Contact a doctor if: Your symptoms do not go away. You feel sick to your stomach (nauseous). You throw up (vomit). You poop less often than normal, or you have trouble pooping (constipation). Summary Vitamin D deficiency is when your body does not have enough vitamin D. Vitamin D helps to keep your bones strong and healthy. This condition is often treated by taking a supplement. Your doctor will tell you what dose is best for you. This information is not intended to replace advice given to you by your health care provider. Make sure you discuss any questions you have with your health care provider. Document Revised: 10/25/2017 Document Reviewed: 10/25/2017 Elsevier Patient Education  2022 Elsevier Inc.  

## 2020-09-24 NOTE — Assessment & Plan Note (Signed)
Encouraged her to consume a low fat diet Continue Atorvastatin 

## 2020-09-24 NOTE — Assessment & Plan Note (Signed)
Continue Ibandronate Encourage daily weightbearing exercise

## 2020-09-24 NOTE — Progress Notes (Signed)
Subjective:    Patient ID: Rhonda Lamb, female    DOB: [birth year], 75 y.o.   MRN: [id number]  HPI  Patient presents the clinic today for 53-month follow-up of chronic conditions.  She is establishing care with me today, transferring care from [facility], NP.  HTN: Her BP today is 137/81.  She is not taking any antihypertensive medications at this time.  ECG from 05/2020 reviewed.  Osteoporosis: Bone density from 12/2019 reviewed.  She is taking Ibandronate as prescribed.  She tries to get 30 minutes of weightbearing exercise daily.  HLD: Her last LDL was 72, triglycerides 025, 04/2020.  She denies myalgias on Atorvastatin.  She tries to consume a low-fat diet.  Vit D Deficiency: Her last Vit D was 11, 04/2020. She is not currently taking any Vit D OTC at this time. Review of Systems     Past Medical History:  Diagnosis Date   Motion sickness    circular motion   Vertigo    none in over 1 year    Current Outpatient Medications  Medication Sig Dispense Refill   atorvastatin (LIPITOR) 10 MG tablet Take 1 tablet (10 mg total) by mouth daily. 90 tablet 3   ibandronate (BONIVA) 150 MG tablet Take 1 tablet (150 mg total) by mouth every 30 (thirty) days. Take in the morning with a full glass of water, on an empty stomach, and do not take anything else by mouth or lie down for at least 1 hour. 12 tablet 3   Vitamin D, Ergocalciferol, (DRISDOL) 1.25 MG (50000 UNIT) CAPS capsule Take 1 capsule (50,000 Units total) by mouth every 7 (seven) days. 12 capsule 0   No current facility-administered medications for this visit.    No Known Allergies  Family History  Problem Relation Age of Onset   Breast cancer Neg Hx     Social History   Socioeconomic History   Marital status: Married    Spouse name: Not on file   Number of children: Not on file   Years of education: Not on file   Highest education level: Not on file  Occupational History   Not on file  Tobacco Use   Smoking status:  Never   Smokeless tobacco: Never  Vaping Use   Vaping Use: Never used  Substance and Sexual Activity   Alcohol use: Never   Drug use: Never   Sexual activity: Not on file  Other Topics Concern   Not on file  Social History Narrative   Not on file   Social Determinants of Health   Financial Resource Strain: Not on file  Food Insecurity: Not on file  Transportation Needs: Not on file  Physical Activity: Not on file  Stress: Not on file  Social Connections: Not on file  Intimate Partner Violence: Not on file     Constitutional: Denies fever, malaise, fatigue, headache or abrupt weight changes.  HEENT: Denies eye pain, eye redness, ear pain, ringing in the ears, wax buildup, runny nose, nasal congestion, bloody nose, or sore throat. Respiratory: Denies difficulty breathing, shortness of breath, cough or sputum production.   Cardiovascular: Denies chest pain, chest tightness, palpitations or swelling in the hands or feet.  Gastrointestinal: Denies abdominal pain, bloating, constipation, diarrhea or blood in the stool.  GU: Denies urgency, frequency, pain with urination, burning sensation, blood in urine, odor or discharge. Musculoskeletal: Denies decrease in range of motion, difficulty with gait, muscle pain or joint pain and swelling.  Skin: Denies redness, rashes,  lesions or ulcercations.  Neurological: Denies dizziness, difficulty with memory, difficulty with speech or problems with balance and coordination.  Psych: Denies anxiety, depression, SI/HI.  No other specific complaints in a complete review of systems (except as listed in HPI above).  Objective:   Physical Exam  BP 137/81 (BP Location: Right Arm, Patient Position: Sitting, Cuff Size: Normal)   Pulse 66   Temp (!) 97.3 F (36.3 C) (Temporal)   Resp 18   Ht 5\' 2"  (1.575 m)   Wt 154 lb 12.8 oz (70.2 kg)   SpO2 100%   BMI 28.31 kg/m   Wt Readings from Last 3 Encounters:  06/18/20 158 lb 6.4 oz (71.8 kg)   06/06/20 152 lb 4.8 oz (69.1 kg)  05/21/20 159 lb (72.1 kg)    General: Appears her stated age, overweight in NAD. Skin: Warm, dry and intact.  HEENT: Head: normal shape and size; Eyes: sclera white and EOMs intact;  Cardiovascular: Normal rate and rhythm. S1,S2 noted.  No murmur, rubs or gallops noted. No JVD or BLE edema. No carotid bruits noted. Pulmonary/Chest: Normal effort and positive vesicular breath sounds. No respiratory distress. No wheezes, rales or ronchi noted.  Musculoskeletal:  No difficulty with gait.  Neurological: Alert and oriented.  Psychiatric: Mood and affect normal. Behavior is normal. Judgment and thought content normal.    BMET    Component Value Date/Time   NA 139 05/21/2020 0913   K 4.2 05/21/2020 0913   CL 105 05/21/2020 0913   CO2 28 05/21/2020 0913   GLUCOSE 87 05/21/2020 0913   BUN 11 05/21/2020 0913   CREATININE 0.85 05/21/2020 0913   CALCIUM 8.8 05/21/2020 0913   GFRNONAA 72 11/16/2019 1058   GFRAA 83 11/16/2019 1058    Lipid Panel     Component Value Date/Time   CHOL 167 05/21/2020 0913   TRIG 128 05/21/2020 0913   HDL 74 05/21/2020 0913   CHOLHDL 2.3 05/21/2020 0913   LDLCALC 72 05/21/2020 0913    CBC    Component Value Date/Time   WBC 4.7 05/21/2020 0913   RBC 4.99 05/21/2020 0913   HGB 15.4 05/21/2020 0913   HCT 46.5 (H) 05/21/2020 0913   PLT 182 05/21/2020 0913   MCV 93.2 05/21/2020 0913   MCH 30.9 05/21/2020 0913   MCHC 33.1 05/21/2020 0913   RDW 12.7 05/21/2020 0913   LYMPHSABS 1,946 05/21/2020 0913   EOSABS 108 05/21/2020 0913   BASOSABS 38 05/21/2020 0913    Hgb A1C No results found for: HGBA1C          Assessment & Plan:   05/23/2020, NP This visit occurred during the SARS-CoV-2 public health emergency.  Safety protocols were in place, including screening questions prior to the visit, additional usage of staff PPE, and extensive cleaning of exam room while observing appropriate contact time as  indicated for disinfecting solutions.

## 2020-09-24 NOTE — Assessment & Plan Note (Signed)
Controlled off meds  Will monitor 

## 2020-09-25 LAB — VITAMIN D 25 HYDROXY (VIT D DEFICIENCY, FRACTURES): Vit D, 25-Hydroxy: 60 ng/mL (ref 30–100)

## 2020-11-29 ENCOUNTER — Other Ambulatory Visit: Payer: Self-pay

## 2020-11-29 DIAGNOSIS — E782 Mixed hyperlipidemia: Secondary | ICD-10-CM

## 2020-11-29 MED ORDER — ATORVASTATIN CALCIUM 10 MG PO TABS: 10.0000 mg | ORAL_TABLET | Freq: Every day | ORAL | 3 refills | Status: DC

## 2020-12-31 ENCOUNTER — Ambulatory Visit: Payer: Self-pay | Admitting: *Deleted

## 2020-12-31 NOTE — Telephone Encounter (Signed)
Called patient and her husband  to review questions regarding covid exposure. Patient 's husband answered and gave information regarding patient and reports she is not experiencing any symptoms of covid at this time. Denies headache, cough, SOB, fever, muscles aches. Patient's husband reports patient being exposed to someone on a bus trip yesterday and was approx 3-4 feet and possibly around positive person 15 minutes or more. Reviewed incubation period  may take 5- 7 days and to test for covid after 5 days. Monitor for symptoms within 2-14 days . Patient's husband reports he and patient have been vaccinated.  Patient's husband reports patient had stomach virus before exposure and is going to get tested today. Care advise given. Patient's husband verbalized understanding of care advise and to call back for further questions and if symptoms noted.  Instructed patient's husband to call back if patient tested positive for covid. Husband verbalized understanding .
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
## 2021-01-01 NOTE — Telephone Encounter (Signed)
Mychart video appt scheduled for tomorrow at 11:20am.

## 2021-01-02 ENCOUNTER — Other Ambulatory Visit: Payer: Self-pay

## 2021-01-02 ENCOUNTER — Encounter: Payer: Self-pay | Admitting: Internal Medicine

## 2021-01-02 ENCOUNTER — Telehealth (INDEPENDENT_AMBULATORY_CARE_PROVIDER_SITE_OTHER): Payer: Medicare Other | Admitting: Internal Medicine

## 2021-01-02 DIAGNOSIS — U071 COVID-19: Secondary | ICD-10-CM | POA: Diagnosis not present

## 2021-01-02 MED ORDER — PREDNISONE 10 MG PO TABS: ORAL_TABLET | ORAL | 0 refills | Status: DC

## 2021-01-02 NOTE — Progress Notes (Signed)
Virtual Visit via Video Note  I connected with Rhonda Lamb on 01/02/21 at 11:20 AM EDT by a video enabled telemedicine application and verified that I am speaking with the correct person using two identifiers.  Location: Patient: Home Provider: Office  Person's participating in this video call: Rhonda Reaper, NP and [name].   I discussed the limitations of evaluation and management by telemedicine and the availability of in person appointments. The patient expressed understanding and agreed to proceed.  History of Present Illness:  Pt reports headache, nasal congestion, post nasal drip, ear fullness and diarrhea. She reports this started 1 week ago. The headache is located in her top of her head. She describes the pain as pounding. She denies dizziness or visual changes. She is blowing clear mucous out of her nose. She denies ear pain, drainage or difficulty swallowing. She denies runny nose, cough, shortness of breath, nausea, vomiting, fever, chills or body aches. She has Tylenol and Alka Seltzer with good relief of symptoms. She tested positive for Covid 12/31/20. She has had sick contacts diagnosed with covid.   Past Medical History:  Diagnosis Date   Motion sickness    circular motion   Vertigo    none in over 1 year    Current Outpatient Medications  Medication Sig Dispense Refill   atorvastatin (LIPITOR) 10 MG tablet Take 1 tablet (10 mg total) by mouth daily. 90 tablet 3   ibandronate (BONIVA) 150 MG tablet Take 1 tablet (150 mg total) by mouth every 30 (thirty) days. Take in the morning with a full glass of water, on an empty stomach, and do not take anything else by mouth or lie down for at least 1 hour. 12 tablet 3   No current facility-administered medications for this visit.    No Known Allergies  Family History  Problem Relation Age of Onset   Breast cancer Neg Hx     Social History   Socioeconomic History   Marital status: Married    Spouse name: Not on  file   Number of children: Not on file   Years of education: Not on file   Highest education level: Not on file  Occupational History   Not on file  Tobacco Use   Smoking status: Never   Smokeless tobacco: Never  Vaping Use   Vaping Use: Never used  Substance and Sexual Activity   Alcohol use: Never   Drug use: Never   Sexual activity: Not on file  Other Topics Concern   Not on file  Social History Narrative   Not on file   Social Determinants of Health   Financial Resource Strain: Not on file  Food Insecurity: Not on file  Transportation Needs: Not on file  Physical Activity: Not on file  Stress: Not on file  Social Connections: Not on file  Intimate Partner Violence: Not on file     Constitutional: Pt reports fatigue or headache. Denies fever, malaise, or abrupt weight changes.  HEENT: Pt reports nasal congestion, post nasal drip and ear fullness. Denies eye pain, eye redness, ear pain, ringing in the ears, wax buildup, runny nose, bloody nose, or sore throat. Respiratory: Denies difficulty breathing, shortness of breath, cough or sputum production.   Cardiovascular: Denies chest pain, chest tightness, palpitations or swelling in the hands or feet.  Gastrointestinal: Pt reports diarrhea. Denies abdominal pain, bloating, constipation, or blood in the stool.  Neurological: Denies dizziness, difficulty with memory, difficulty with speech or problems with balance and  coordination.    No other specific complaints in a complete review of systems (except as listed in HPI above).    Observations/Objective:  There were no vitals taken for this visit. Wt Readings from Last 3 Encounters:  09/24/20 154 lb 12.8 oz (70.2 kg)  06/18/20 158 lb 6.4 oz (71.8 kg)  06/06/20 152 lb 4.8 oz (69.1 kg)    General: Appears her stated age, appears unwell but in NAD. HEENT: Head: normal shape and size; Nose: congestion noted; Throat/Mouth: hoarseness noted Pulmonary/Chest: Normal effort .  No respiratory distress. No Neurological: Alert and oriented.   BMET    Component Value Date/Time   NA 139 05/21/2020 0913   K 4.2 05/21/2020 0913   CL 105 05/21/2020 0913   CO2 28 05/21/2020 0913   GLUCOSE 87 05/21/2020 0913   BUN 11 05/21/2020 0913   CREATININE 0.85 05/21/2020 0913   CALCIUM 8.8 05/21/2020 0913   GFRNONAA 72 11/16/2019 1058   GFRAA 83 11/16/2019 1058    Lipid Panel     Component Value Date/Time   CHOL 167 05/21/2020 0913   TRIG 128 05/21/2020 0913   HDL 74 05/21/2020 0913   CHOLHDL 2.3 05/21/2020 0913   LDLCALC 72 05/21/2020 0913    CBC    Component Value Date/Time   WBC 4.7 05/21/2020 0913   RBC 4.99 05/21/2020 0913   HGB 15.4 05/21/2020 0913   HCT 46.5 (H) 05/21/2020 0913   PLT 182 05/21/2020 0913   MCV 93.2 05/21/2020 0913   MCH 30.9 05/21/2020 0913   MCHC 33.1 05/21/2020 0913   RDW 12.7 05/21/2020 0913   LYMPHSABS 1,946 05/21/2020 0913   EOSABS 108 05/21/2020 0913   BASOSABS 38 05/21/2020 0913    Hgb A1C No results found for: HGBA1C     Assessment and Plan:  Covid 19:  Improving Outside the window for oral antiviral therapy RX for Pred Taper x 6 days for symptom management Encouraged rest and fluids Ok to take Tylenol and antidiarrheal OTC as needed Discussed quarantine precautions  Follow Up Instructions:    I discussed the assessment and treatment plan with the patient. The patient was provided an opportunity to ask questions and all were answered. The patient agreed with the plan and demonstrated an understanding of the instructions.   The patient was advised to call back or seek an in-person evaluation if the symptoms worsen or if the condition fails to improve as anticipated.   Rhonda Reaper, NP

## 2021-01-02 NOTE — Patient Instructions (Signed)

## 2021-01-20 ENCOUNTER — Other Ambulatory Visit: Payer: Self-pay | Admitting: Family Medicine

## 2021-04-07 ENCOUNTER — Ambulatory Visit: Payer: Self-pay

## 2021-04-07 NOTE — Telephone Encounter (Signed)
°  Chief Complaint: URI Symptoms: cough, congestion, sore throat fever but went away Frequency: since Friday Pertinent Negatives: Patient denies SOB Disposition: [] ED /[] Urgent Care (no appt availability in office) / [x] Appointment(In office/virtual)/ []  [facility]/ [] Home Care/ [] Refused Recommended Disposition /[] [facility]/ []  Follow-up with PCP Additional Notes:   Summary: advice - cough / congestion   Pt called in stating she has really bad congestion / cough / really disruptive cough / pt wanted to know if something could be called in or what she should take over the counter.       Reason for Disposition  [1] Sinus pain (not just congestion) AND [2] fever  Answer Assessment - Initial Assessment Questions 1. ONSET: "When did the nasal discharge start?"      friday 2. AMOUNT: "How much discharge is there?"      Moderate  3. COUGH: "Do you have a cough?" If yes, ask: "Describe the color of your sputum" (clear, white, yellow, green)     Thick light yellow 4. RESPIRATORY DISTRESS: "Describe your breathing."      No 5. FEVER: "Do you have a fever?" If Yes, ask: "What is your temperature, how was it measured, and when did it start?"     No 6. SEVERITY: "Overall, how bad are you feeling right now?" (e.g., doesn't interfere with normal activities, staying home from school/work, staying in bed)      Unable to do normal activities 7. OTHER SYMPTOMS: "Do you have any other symptoms?" (e.g., sore throat, earache, wheezing, vomiting)     Sore throat  Protocols used: Common Cold-A-AH

## 2021-04-08 ENCOUNTER — Encounter: Payer: Self-pay | Admitting: Internal Medicine

## 2021-04-08 ENCOUNTER — Telehealth (INDEPENDENT_AMBULATORY_CARE_PROVIDER_SITE_OTHER): Payer: Medicare Other | Admitting: Internal Medicine

## 2021-04-08 DIAGNOSIS — J329 Chronic sinusitis, unspecified: Secondary | ICD-10-CM | POA: Diagnosis not present

## 2021-04-08 DIAGNOSIS — B9789 Other viral agents as the cause of diseases classified elsewhere: Secondary | ICD-10-CM | POA: Diagnosis not present

## 2021-04-08 MED ORDER — IBANDRONATE SODIUM 150 MG PO TABS: 150.0000 mg | ORAL_TABLET | ORAL | 0 refills | Status: DC

## 2021-04-08 MED ORDER — PREDNISONE 10 MG PO TABS: ORAL_TABLET | ORAL | 0 refills | Status: DC

## 2021-04-08 MED ORDER — HYDROCODONE BIT-HOMATROP MBR 5-1.5 MG/5ML PO SOLN: 5.0000 mL | Freq: Three times a day (TID) | ORAL | 0 refills | Status: DC | PRN

## 2021-04-08 NOTE — Patient Instructions (Signed)

## 2021-04-08 NOTE — Progress Notes (Signed)
Virtual Visit via Video Note  I connected with Rhonda Lamb on 04/08/21 at 11:20 AM EST by a video enabled telemedicine application and verified that I am speaking with the correct person using two identifiers.  Location: Patient: Home Provider: Office  Person's participating in this video call: Rhonda Reaper, NP and [name].   I discussed the limitations of evaluation and management by telemedicine and the availability of in person appointments. The patient expressed understanding and agreed to proceed.  History of Present Illness:  Pt reports headache, sinus pressure, nasal congestion, sore throat, cough. This started 5 days. The headache is located in the top of head. She describes the pain as pressure. The headache has resolved. She is blowing yellow mucous out of her nose. She denies difficulty swallowing. The cough is productive of yellow mucous. She denies runny nose, ear pain, shortness of breath, fever, chills or body aches. She has not tried anything OTC for this. She has had a negative home covid test. She has no history of asthma or COPD.   Past Medical History:  Diagnosis Date   Motion sickness    circular motion   Vertigo    none in over 1 year    Current Outpatient Medications  Medication Sig Dispense Refill   atorvastatin (LIPITOR) 10 MG tablet Take 1 tablet (10 mg total) by mouth daily. 90 tablet 3   ibandronate (BONIVA) 150 MG tablet Take 1 tablet (150 mg total) by mouth every 30 (thirty) days. Take in the morning with a full glass of water, on an empty stomach, and do not take anything else by mouth or lie down for at least 1 hour. 12 tablet 3   predniSONE (DELTASONE) 10 MG tablet Take 6 tabs on day 1, 5 tabs on day 2, 4 tabs on day 3, 3 tabs on day 4, 2 tabs on day 5, 1 tab on day 6 21 tablet 0   No current facility-administered medications for this visit.    No Known Allergies  Family History  Problem Relation Age of Onset   Breast cancer Neg Hx      Social History   Socioeconomic History   Marital status: Married    Spouse name: Not on file   Number of children: Not on file   Years of education: Not on file   Highest education level: Not on file  Occupational History   Not on file  Tobacco Use   Smoking status: Never   Smokeless tobacco: Never  Vaping Use   Vaping Use: Never used  Substance and Sexual Activity   Alcohol use: Never   Drug use: Never   Sexual activity: Not on file  Other Topics Concern   Not on file  Social History Narrative   Not on file   Social Determinants of Health   Financial Resource Strain: Not on file  Food Insecurity: Not on file  Transportation Needs: Not on file  Physical Activity: Not on file  Stress: Not on file  Social Connections: Not on file  Intimate Partner Violence: Not on file     Constitutional: Pt reports fatigue. Denies fever, malaise, headache or abrupt weight changes.  HEENT: Pt reports sinus pressure, nasal congestion and sore throat. Denies eye pain, eye redness, ear pain, ringing in the ears, wax buildup, runny nose, bloody nose. Respiratory: Pt report cough. Denies difficulty breathing, shortness of breath.   Cardiovascular: Denies chest pain, chest tightness, palpitations or swelling in the hands or feet.  Gastrointestinal:  Denies abdominal pain, bloating, constipation, diarrhea or blood in the stool.   No other specific complaints in a complete review of systems (except as listed in HPI above).    Observations/Objective:    Wt Readings from Last 3 Encounters:  09/24/20 154 lb 12.8 oz (70.2 kg)  06/18/20 158 lb 6.4 oz (71.8 kg)  06/06/20 152 lb 4.8 oz (69.1 kg)    General: In NAD. HEENT: Nose: Congestion noted; Throat/Mouth: No hoarseness noted Pulmonary/Chest: Normal effort. No respiratory distress.   Neurological: Alert and oriented.   BMET    Component Value Date/Time   NA 139 05/21/2020 0913   K 4.2 05/21/2020 0913   CL 105 05/21/2020 0913    CO2 28 05/21/2020 0913   GLUCOSE 87 05/21/2020 0913   BUN 11 05/21/2020 0913   CREATININE 0.85 05/21/2020 0913   CALCIUM 8.8 05/21/2020 0913   GFRNONAA 72 11/16/2019 1058   GFRAA 83 11/16/2019 1058    Lipid Panel     Component Value Date/Time   CHOL 167 05/21/2020 0913   TRIG 128 05/21/2020 0913   HDL 74 05/21/2020 0913   CHOLHDL 2.3 05/21/2020 0913   LDLCALC 72 05/21/2020 0913    CBC    Component Value Date/Time   WBC 4.7 05/21/2020 0913   RBC 4.99 05/21/2020 0913   HGB 15.4 05/21/2020 0913   HCT 46.5 (H) 05/21/2020 0913   PLT 182 05/21/2020 0913   MCV 93.2 05/21/2020 0913   MCH 30.9 05/21/2020 0913   MCHC 33.1 05/21/2020 0913   RDW 12.7 05/21/2020 0913   LYMPHSABS 1,946 05/21/2020 0913   EOSABS 108 05/21/2020 0913   BASOSABS 38 05/21/2020 0913    Hgb A1C No results found for: HGBA1C     Assessment and Plan:  Viral Sinusitis:  Can use a Nettie pot which can be purchased from your local pharmacy Encourage rest and fluids Rx for Pred taper x6 days Advised her to start Zyrtec and Flonase OTC when she is done with the prednisone Rx for Hycodan as needed for cough-sedation caution given  Return precautions discussed  Follow Up Instructions:    I discussed the assessment and treatment plan with the patient. The patient was provided an opportunity to ask questions and all were answered. The patient agreed with the plan and demonstrated an understanding of the instructions.   The patient was advised to call back or seek an in-person evaluation if the symptoms worsen or if the condition fails to improve as anticipated.    Rhonda Reaper, NP

## 2021-04-10 ENCOUNTER — Ambulatory Visit: Payer: Self-pay | Admitting: *Deleted

## 2021-04-10 ENCOUNTER — Other Ambulatory Visit: Payer: Self-pay

## 2021-04-10 ENCOUNTER — Encounter: Payer: Self-pay | Admitting: Internal Medicine

## 2021-04-10 ENCOUNTER — Ambulatory Visit (INDEPENDENT_AMBULATORY_CARE_PROVIDER_SITE_OTHER): Payer: Medicare Other | Admitting: Internal Medicine

## 2021-04-10 VITALS — BP 132/86 | HR 79 | Temp 97.1°F | Wt 149.0 lb

## 2021-04-10 DIAGNOSIS — J208 Acute bronchitis due to other specified organisms: Secondary | ICD-10-CM | POA: Diagnosis not present

## 2021-04-10 MED ORDER — ALBUTEROL SULFATE HFA 108 (90 BASE) MCG/ACT IN AERS: 2.0000 | INHALATION_SPRAY | Freq: Four times a day (QID) | RESPIRATORY_TRACT | 0 refills | Status: DC | PRN

## 2021-04-10 MED ORDER — PROMETHAZINE-DM 6.25-15 MG/5ML PO SYRP: 5.0000 mL | ORAL_SOLUTION | Freq: Every evening | ORAL | 0 refills | Status: DC | PRN

## 2021-04-10 NOTE — Telephone Encounter (Signed)
Reason for Disposition  [1] Longstanding difficulty breathing AND [2] not responding to usual therapy  Answer Assessment - Initial Assessment Questions 1. RESPIRATORY STATUS: "Describe your breathing?" (e.g., wheezing, shortness of breath, unable to speak, severe coughing)      Pt calling in c/o wheezing.   I have congestion that started Sat. And coughing. 2. ONSET: "When did this breathing problem begin?"      Saturday   I was seen on the 7th virtual visit and given prednisone.   I don't feel any better.   Under my neck I have a lot of congestion.   I did a Covid test negative at home.    3. PATTERN "Does the difficult breathing come and go, or has it been constant since it started?"      I'm coughing up mucus and having tightness in my upper chest.   The mucus is thick but clear. 4. SEVERITY: "How bad is your breathing?" (e.g., mild, moderate, severe)    - MILD: No SOB at rest, mild SOB with walking, speaks normally in sentences, can lie down, no retractions, pulse < 100.    - MODERATE: SOB at rest, SOB with minimal exertion and prefers to sit, cannot lie down flat, speaks in phrases, mild retractions, audible wheezing, pulse 100-120.    - SEVERE: Very SOB at rest, speaks in single words, struggling to breathe, sitting hunched forward, retractions, pulse > 120      *No Answer* 5. RECURRENT SYMPTOM: "Have you had difficulty breathing before?" If Yes, ask: "When was the last time?" and "What happened that time?"      *No Answer* 6. CARDIAC HISTORY: "Do you have any history of heart disease?" (e.g., heart attack, angina, bypass surgery, angioplasty)      *No Answer* 7. LUNG HISTORY: "Do you have any history of lung disease?"  (e.g., pulmonary embolus, asthma, emphysema)     *No Answer* 8. CAUSE: "What do you think is causing the breathing problem?"      *No Answer* 9. OTHER SYMPTOMS: "Do you have any other symptoms? (e.g., dizziness, runny nose, cough, chest pain, fever)     *No Answer* 10.  O2 SATURATION MONITOR:  "Do you use an oxygen saturation monitor (pulse oximeter) at home?" If Yes, "What is your reading (oxygen level) today?" "What is your usual oxygen saturation reading?" (e.g., 95%)       *No Answer* 11. PREGNANCY: "Is there any chance you are pregnant?" "When was your last menstrual period?"       *No Answer* 12. TRAVEL: "Have you traveled out of the country in the last month?" (e.g., travel history, exposures)       *No Answer*  Protocols used: Breathing Difficulty-A-AH

## 2021-04-10 NOTE — Patient Instructions (Signed)
Acute Bronchitis, Adult ?Acute bronchitis is when air tubes in the lungs (bronchi) suddenly get swollen. The condition can make it hard for you to breathe. In adults, acute bronchitis usually goes away within 2 weeks. A cough caused by bronchitis may last up to 3 weeks. Smoking, allergies, and asthma can make the condition worse. ?What are the causes? ?Germs that cause cold and flu (viruses). The most common cause of this condition is the virus that causes the common cold. ?Bacteria. ?Substances that bother (irritate) the lungs, including: ?Smoke from cigarettes and other types of tobacco. ?Dust and pollen. ?Fumes from chemicals, gases, or burned fuel. ?Indoor or outdoor air pollution. ?What increases the risk? ?A weak body's defense system. This is also called the immune system. ?Any condition that affects your lungs and breathing, such as asthma. ?What are the signs or symptoms? ?A cough. ?Coughing up clear, yellow, or green mucus. ?Making high-pitched whistling sounds when you breathe, most often when you breathe out (wheezing). ?Runny or stuffy nose. ?Having too much mucus in your lungs (chest congestion). ?Shortness of breath. ?Body aches. ?A sore throat. ?How is this treated? ?Acute bronchitis may go away over time without treatment. Your doctor may tell you to: ?Drink more fluids. This will help thin your mucus so it is easier to cough up. ?Use a device that gets medicine into your lungs (inhaler). ?Use a vaporizer or a humidifier. These are machines that add water to the air. This helps with coughing and poor breathing. ?Take a medicine that thins mucus and helps clear it from your lungs. ?Take a medicine that prevents or stops coughing. ?It is not common to take an antibiotic medicine for this condition. ?Follow these instructions at home: ? ?Take over-the-counter and prescription medicines only as told by your doctor. ?Use an inhaler, vaporizer, or humidifier as told by your doctor. ?Take two teaspoons (10  mL) of honey at bedtime. This helps lessen your coughing at night. ?Drink enough fluid to keep your pee (urine) pale yellow. ?Do not smoke or use any products that contain nicotine or tobacco. If you need help quitting, ask your doctor. ?Get a lot of rest. ?Return to your normal activities when your doctor says that it is safe. ?Keep all follow-up visits. ?How is this prevented? ? ?Wash your hands often with soap and water for at least 20 seconds. If you cannot use soap and water, use hand sanitizer. ?Avoid contact with people who have cold symptoms. ?Try not to touch your mouth, nose, or eyes with your hands. ?Avoid breathing in smoke or chemical fumes. ?Make sure to get the flu shot every year. ?Contact a doctor if: ?Your symptoms do not get better in 2 weeks. ?You have trouble coughing up the mucus. ?Your cough keeps you awake at night. ?You have a fever. ?Get help right away if: ?You cough up blood. ?You have chest pain. ?You have very bad shortness of breath. ?You faint or keep feeling like you are going to faint. ?You have a very bad headache. ?Your fever or chills get worse. ?These symptoms may be an emergency. Get help right away. Call your local emergency services (911 in the [place] ?Do not wait to see if the symptoms will go away. ?Do not drive yourself to the hospital. ?Summary ?Acute bronchitis is when air tubes in the lungs (bronchi) suddenly get swollen. In adults, acute bronchitis usually goes away within 2 weeks. ?Drink more fluids. This will help thin your mucus so it is easier to   cough up. ?Take over-the-counter and prescription medicines only as told by your doctor. ?Contact a doctor if your symptoms do not improve after 2 weeks of treatment. ?This information is not intended to replace advice given to you by your health care provider. Make sure you discuss any questions you have with your health care provider. ?Document Revised: 06/19/2020 Document Reviewed: 06/19/2020 ?Elsevier Patient Education  ? 2022 [employer] ? ?

## 2021-04-10 NOTE — Progress Notes (Signed)
HPI  Patient presents the clinic today with complaint of nasal congestion, cough, chest congestion, shortness of breath and wheezing. This started 1 week ago.  She is blowing clear mucus out of her nose.  The cough is non productive.  She thinks the shortness on breath and wheezing is related to a adverse reaction from the cough syrup.  She denies runny nose, ear pain, sore throat, chest pain, nausea, vomiting, diarrhea.  She denies fever, chills or body aches. She was seen 2/7 virtually for the same.  She was prescribed Prednisone for viral sinusitis.  She reports no improvement with her symptoms but admits that she has been taking the prednisone spread out throughout the day and not all at 1 time.  She had a negative COVID test at home.  She does not smoke.  Review of Systems     Past Medical History:  Diagnosis Date   Motion sickness    circular motion   Vertigo    none in over 1 year    Family History  Problem Relation Age of Onset   Breast cancer Neg Hx     Social History   Socioeconomic History   Marital status: Married    Spouse name: Not on file   Number of children: Not on file   Years of education: Not on file   Highest education level: Not on file  Occupational History   Not on file  Tobacco Use   Smoking status: Never   Smokeless tobacco: Never  Vaping Use   Vaping Use: Never used  Substance and Sexual Activity   Alcohol use: Never   Drug use: Never   Sexual activity: Not on file  Other Topics Concern   Not on file  Social History Narrative   Not on file   Social Determinants of Health   Financial Resource Strain: Not on file  Food Insecurity: Not on file  Transportation Needs: Not on file  Physical Activity: Not on file  Stress: Not on file  Social Connections: Not on file  Intimate Partner Violence: Not on file    No Known Allergies   Constitutional:  Denies headache, fatigue, fever abrupt weight changes.  HEENT:  Positive nasal congestion.  Denies eye redness, ear pain, ringing in the ears, wax buildup, runny nose or sore throat Respiratory: Positive cough, wheezing and shortness of breath. Denies difficulty breathing.  Cardiovascular: Denies chest pain, chest tightness, palpitations or swelling in the hands or feet.   No other specific complaints in a complete review of systems (except as listed in HPI above).  Objective:  BP 132/86 (BP Location: Left Arm, Patient Position: Sitting, Cuff Size: Normal)    Pulse 79    Temp (!) 97.1 F (36.2 C) (Temporal)    Wt 149 lb (67.6 kg)    SpO2 99%    BMI 27.25 kg/m    General: Appears her stated age, in NAD. HEENT: Head: normal shape and size, no sinus tenderness noted; Eyes: sclera white, no icterus, conjunctiva pink; Neck:  No adenopathy noted.  Cardiovascular: Normal rate and rhythm. S1,S2 noted.  No murmur, rubs or gallops noted.  Pulmonary/Chest: Normal effort and positive vesicular breath sounds with wheezing noted of the left middle and left lower lobe. No respiratory distress. No rales or ronchi noted.       Assessment & Plan:   Viral Bronchitis:  Take Prednisone all in the morning until finished Rx for Albuterol 2 puffs every 6 hours as needed Will D/C  Hycodan as she did not tolerate this Rx for promethazine DM at bedtime-sedation caution given No indication for antibiotics at this time  RTC as needed or if symptoms persist. Webb Silversmith, NP This visit occurred during the SARS-CoV-2 public health emergency.  Safety protocols were in place, including screening questions prior to the visit, additional usage of staff PPE, and extensive cleaning of exam room while observing appropriate contact time as indicated for disinfecting solutions.

## 2021-04-10 NOTE — Telephone Encounter (Signed)
°  Chief Complaint: Continued wheezing and upper chest tightness and coughing even with the prednisone Symptoms: above (Had virtual visit given prednisone and cough medication.   Cough med makes her nauseas and dizzy so can't take it. Frequency: constant Pertinent Negatives: Patient denies feeling any better Disposition: [] ED /[] Urgent Care (no appt availability in office) / [x] Appointment(In office/virtual)/ []  [facility]/ [] Home Care/ [] Refused Recommended Disposition /[] [facility]/ []  Follow-up with PCP Additional Notes:

## 2021-04-15 ENCOUNTER — Other Ambulatory Visit: Payer: Self-pay

## 2021-04-15 ENCOUNTER — Ambulatory Visit (INDEPENDENT_AMBULATORY_CARE_PROVIDER_SITE_OTHER): Payer: Medicare Other

## 2021-04-15 DIAGNOSIS — Z0000 Encounter for general adult medical examination without abnormal findings: Secondary | ICD-10-CM | POA: Diagnosis not present

## 2021-04-15 NOTE — Progress Notes (Addendum)
Virtual Visit via Telephone Note  I connected with Rhonda Lamb on 04/15/21 at  1:40 PM EST by telephone and verified that I am speaking with the correct person using two identifiers.  Location: [name] Provider: [name], NP   I discussed the limitations, risks, security and privacy concerns of performing an evaluation and management service by telephone and the availability of in person appointments. I also discussed with the patient that there may be a patient responsible charge related to this service. The patient expressed understanding and agreed to proceed.   I provided 30 minutes of non-face-to-face time during this encounter.   Lonna Cobb, CMA   HPI:  Patient presents to clinic today for their subsequent annual Medicare wellness exam.  Past Medical History:  Diagnosis Date   Motion sickness    circular motion   Vertigo    none in over 1 year    Current Outpatient Medications  Medication Sig Dispense Refill   albuterol (VENTOLIN HFA) 108 (90 Base) MCG/ACT inhaler Inhale 2 puffs into the lungs every 6 (six) hours as needed for wheezing or shortness of breath. 8 g 0   atorvastatin (LIPITOR) 10 MG tablet Take 1 tablet (10 mg total) by mouth daily. 90 tablet 3   ibandronate (BONIVA) 150 MG tablet Take 1 tablet (150 mg total) by mouth every 30 (thirty) days. Take in the morning with a full glass of water, on an empty stomach, and do not take anything else by mouth or lie down for at least 1 hour. 3 tablet 0   promethazine-dextromethorphan (PROMETHAZINE-DM) 6.25-15 MG/5ML syrup Take 5 mLs by mouth at bedtime as needed for cough. (Patient not taking: Reported on 04/15/2021) 118 mL 0   No current facility-administered medications for this visit.    No Known Allergies  Family History  Problem Relation Age of Onset   Breast cancer Neg Hx     Social History   Socioeconomic History   Marital status: Married    Spouse name: Not on file   Number of  children: 3   Years of education: Not on file   Highest education level: Not on file  Occupational History   Occupation: Accounting  Tobacco Use   Smoking status: Never   Smokeless tobacco: Never  Vaping Use   Vaping Use: Never used  Substance and Sexual Activity   Alcohol use: Never   Drug use: Never   Sexual activity: Not on file  Other Topics Concern   Not on file  Social History Narrative   Not on file   Social Determinants of Health   Financial Resource Strain: Low Risk    Difficulty of Paying Living Expenses: Not hard at all  Food Insecurity: No Food Insecurity   Worried About Programme researcher, broadcasting/film/video in the Last Year: Never true   Ran Out of Food in the Last Year: Never true  Transportation Needs: No Transportation Needs   Lack of Transportation (Medical): No   Lack of Transportation (Non-Medical): No  Physical Activity: Inactive   Days of Exercise per Week: 0 days   Minutes of Exercise per Session: 0 min  Stress: No Stress Concern Present   Feeling of Stress : Not at all  Social Connections: Socially Integrated   Frequency of Communication with Friends and Family: More than three times a week   Frequency of Social Gatherings with Friends and Family: Once a week   Attends Religious Services: More than 4 times per year  Active Member of Clubs or Organizations: Yes   Attends Banker Meetings: More than 4 times per year   Marital Status: Married  Intimate Partner Violence: Not on file    Hospitiliaztions: No recent hospitalization   Health Maintenance: Influenza: 11/16/2019 Pfizer: 05/15/2019, 04/24/2019 Prevnar: 08/24/2013 Pneumococcal 23: 01/06/2017 TDAP: 02/11/2011.  Pt informed that she can get her TDAP vaccine at no cost at her local pharmacy. Shingrix vaccine:  Pt informed that she can get her shingle vaccine at no cost at her local pharmacy.  Mammogram: 12/07/2019, next mammogram 12/06/2021 Colonoscopy: 06/06/2020. Repeat  06/07/2030  Dexa  Scan: 12/07/2019       Providers:  PCP: [name], NP Gastroenterologist: Midge Minium, MD    I have personally reviewed and have noted:  1. The patient's medical and social history 2. Their use of alcohol, tobacco or illicit drugs 3. Their current medications and supplements 4. The patient's functional ability including ADL's, fall risks, home safety risks and hearing or visual impairment. 5. Diet and physical activities 6. Evidence for depression or mood disorder  Subjective:   Review of Systems:   Constitutional: Denies fever, malaise, fatigue, headache or abrupt weight changes.  HEENT: Denies eye pain, eye redness, ear pain, ringing in the ears, wax buildup, runny nose, nasal congestion, bloody nose, or sore throat. Respiratory: Denies difficulty breathing, shortness of breath, cough or sputum production.   Cardiovascular: Denies chest pain, chest tightness, palpitations or swelling in the hands or feet.  Gastrointestinal: Denies abdominal pain, bloating, constipation, diarrhea or blood in the stool.  GU: Denies urgency, frequency, pain with urination, burning sensation, blood in urine, odor or discharge. Musculoskeletal: Denies decrease in range of motion, difficulty with gait, muscle pain or joint pain and swelling.  Skin: Denies redness, rashes, lesions or ulcercations.  Neurological: Denies dizziness, difficulty with memory, difficulty with speech or problems with balance and coordination.  Psych: Denies anxiety, depression, SI/HI.  No other specific complaints in a complete review of systems (except as listed in HPI above).  Objective:  PE:   There were no vitals taken for this visit. Wt Readings from Last 3 Encounters:  04/10/21 149 lb (67.6 kg)  09/24/20 154 lb 12.8 oz (70.2 kg)  06/18/20 158 lb 6.4 oz (71.8 kg)     BMET    Component Value Date/Time   NA 139 05/21/2020 0913   K 4.2 05/21/2020 0913   CL 105 05/21/2020 0913   CO2 28 05/21/2020 0913    GLUCOSE 87 05/21/2020 0913   BUN 11 05/21/2020 0913   CREATININE 0.85 05/21/2020 0913   CALCIUM 8.8 05/21/2020 0913   GFRNONAA 72 11/16/2019 1058   GFRAA 83 11/16/2019 1058     Assessment and Plan:   Medicare Annual Wellness Visit:  Diet: Heart healthy  Physical activity: Sedentary Depression/mood screen: Negative,  Flowsheet Row Office Visit from 04/15/2021 in [place]  PHQ-9 Total Score 0      Hearing: Intact to whispered voice Visual acuity: Grossly normal, performs annual eye exam  ADLs: Capable Fall risk: None Home safety: Good Cognitive evaluation:  6CIT Screen 04/15/2021 04/15/2021  What Year? 0 points 0 points  What month? 0 points 0 points  What time? 0 points 0 points  Count back from 20 0 points 0 points  Months in reverse 0 points 0 points  Repeat phrase 0 points 0 points  Total Score 0 0   Does this person have serious difficulty walking or climbing stairs?: No  What devices do you use to assist with ambulation?: None Does this person have difficulty dressing or bathing?: No Do you have difficulty eating or preparing food?: No Do you have difficulty using the toilet?: No Do you have difficulty moving around from place to place?: No Do you have difficulty managing your finances?: No Do you have difficulty shopping or going to appointments without assistance?: No Do you have difficulty using the telephone?: No Do you have difficulty managing your medications?: No Who helps you with your medications?: No one Do you have difficulty doing housework or laundry?: No Do you have difficulty driving?: No How do you obtain transportation?: Independent driver   EOL planning: No Adv directives, full code  Nurse's Notes: The patient recommended to take 5000 units of Vitamin D3 OTC and Calcium 800 MG.    Next appointment: Annual Physical scheduled for 06/04/2011 @  8:00am. The pt recommended to come fasting for labs.   Lonna Cobb, CMA

## 2021-04-24 NOTE — Addendum Note (Signed)
Addended by: Lonna Cobb on: 04/24/2021 03:53 PM   Modules accepted: Level of Service

## 2021-05-02 ENCOUNTER — Other Ambulatory Visit: Payer: Self-pay | Admitting: Internal Medicine

## 2021-05-02 NOTE — Telephone Encounter (Signed)
Requested medication (s) are due for refill today - no ? ?Requested medication (s) are on the active medication list -yes ? ?Future visit scheduled -yes ? ?Last refill: today ? ?Notes to clinic: Pharmacy request 90 day supply- request sent for review  ? ?Requested Prescriptions  ?Pending Prescriptions Disp Refills  ? albuterol (VENTOLIN HFA) 108 (90 Base) MCG/ACT inhaler [Pharmacy Med Name: ALBUTEROL HFA INH(200 PUFFS)18GM] 54 g   ?  Sig: INHALE 2 PUFFS INTO THE LUNGS EVERY 6 HOURS AS NEEDED FOR WHEEZING OR SHORTNESS OF BREATH  ?  ? Pulmonology:  Beta Agonists 2 Passed - 05/02/2021 11:44 AM  ?  ?  Passed - Last BP in normal range  ?  BP Readings from Last 1 Encounters:  ?04/10/21 132/86  ?  ?  ?  ?  Passed - Last Heart Rate in normal range  ?  Pulse Readings from Last 1 Encounters:  ?04/10/21 79  ?  ?  ?  ?  Passed - Valid encounter within last 12 months  ?  Recent Outpatient Visits   ? ?      ? 3 weeks ago Viral bronchitis  ? [facility] [place], [place] W, NP  ? 3 weeks ago Viral sinusitis  ? [facility] [place], [place] W, NP  ? 4 months ago COVID-19  ? [facility] [place], [place] W, NP  ? 7 months ago Vitamin D deficiency  ? [facility] [place], [place] W, NP  ? 10 months ago Essential hypertension  ? [facility] Gabriel Cirri, NP  ? ?  ?  ?Future Appointments   ? ?        ? In 1 month Baity, Salvadore Oxford, NP [facility]  ? ?  ? ?  ?  ?  ? ? ? ?Requested Prescriptions  ?Pending Prescriptions Disp Refills  ? albuterol (VENTOLIN HFA) 108 (90 Base) MCG/ACT inhaler [Pharmacy Med Name: ALBUTEROL HFA INH(200 PUFFS)18GM] 54 g   ?  Sig: INHALE 2 PUFFS INTO THE LUNGS EVERY 6 HOURS AS NEEDED FOR WHEEZING OR SHORTNESS OF BREATH  ?  ? Pulmonology:  Beta Agonists 2 Passed - 05/02/2021 11:44 AM  ?  ?  Passed - Last BP in normal range  ?  BP Readings from Last 1 Encounters:  ?04/10/21 132/86  ?  ?  ?  ?  Passed - Last Heart Rate in normal range   ?  Pulse Readings from Last 1 Encounters:  ?04/10/21 79  ?  ?  ?  ?  Passed - Valid encounter within last 12 months  ?  Recent Outpatient Visits   ? ?      ? 3 weeks ago Viral bronchitis  ? [facility] [place], [place] W, NP  ? 3 weeks ago Viral sinusitis  ? [facility] [place], [place] W, NP  ? 4 months ago COVID-19  ? [facility] [place], [place] W, NP  ? 7 months ago Vitamin D deficiency  ? [facility] [place], [place] W, NP  ? 10 months ago Essential hypertension  ? [facility] Gabriel Cirri, NP  ? ?  ?  ?Future Appointments   ? ?        ? In 1 month Baity, Salvadore Oxford, NP [facility]  ? ?  ? ?  ?  ?  ? ? ? ?

## 2021-05-02 NOTE — Telephone Encounter (Signed)
Requested medication (s) are due for refill today: yes ? ?Requested medication (s) are on the active medication list: yes ? ?Last refill:  04/10/21 #8 g 0 refills ? ?Future visit scheduled: yes in 1 month ? ?Notes to clinic:  pharmacy request # 18g . Do you want to refill for #18g and how many refills? ? ?  ?Requested Prescriptions  ?Pending Prescriptions Disp Refills  ? VENTOLIN HFA 108 (90 Base) MCG/ACT inhaler [Pharmacy Med Name: VENTOLIN HFA INH W/DOS CTR 200PUFFS] 18 g   ?  Sig: INHALE 2 PUFFS INTO THE LUNGS EVERY 6 HOURS AS NEEDED FOR WHEEZING OR SHORTNESS OF BREATH  ?  ? Pulmonology:  Beta Agonists 2 Passed - 05/02/2021  3:21 AM  ?  ?  Passed - Last BP in normal range  ?  BP Readings from Last 1 Encounters:  ?04/10/21 132/86  ?  ?  ?  ?  Passed - Last Heart Rate in normal range  ?  Pulse Readings from Last 1 Encounters:  ?04/10/21 79  ?  ?  ?  ?  Passed - Valid encounter within last 12 months  ?  Recent Outpatient Visits   ? ?      ? 3 weeks ago Viral bronchitis  ? [facility] [place], [place] W, NP  ? 3 weeks ago Viral sinusitis  ? [facility] [place], [place] W, NP  ? 4 months ago COVID-19  ? [facility] [place], [place] W, NP  ? 7 months ago Vitamin D deficiency  ? [facility] [place], [place] W, NP  ? 10 months ago Essential hypertension  ? [facility] Kathrine Haddock, NP  ? ?  ?  ?Future Appointments   ? ?        ? In 1 month Baity, Coralie Keens, NP [facility], [place]  ? ?  ? ?  ?  ?  ? ?

## 2021-06-03 ENCOUNTER — Encounter: Payer: Medicare Other | Admitting: Internal Medicine

## 2021-06-12 ENCOUNTER — Encounter: Payer: Medicare Other | Admitting: Internal Medicine

## 2021-06-12 NOTE — Progress Notes (Deleted)
? ?Subjective:  ? ? Patient ID: Rhonda Lamb, female    DOB: 09-27-1945, 76 y.o.   MRN: [id number] ? ?HPI ? ?Patient presents to clinic today for follow-up of chronic conditions. ? ?HTN: Her BP today is.  She is not taking any antihypertensive medications at this time.  ECG from 05/2020 reviewed. ? ?Osteoporosis: She is taking Ibandronate as prescribed.  She is not currently taking any calcium and vitamin D OTC.  She tries to get weightbearing exercise daily.  Bone density from 12/2019 reviewed. ? ?HLD: Her last LDL was 72, triglycerides 102128, 04/2020.  She denies myalgias on Atorvastatin.  She tries to consume a low-fat diet. ? ? ?Review of Systems ? ?   ?Past Medical History:  ?Diagnosis Date  ? Motion sickness   ? circular motion  ? Vertigo   ? none in over 1 year  ? ? ?Current Outpatient Medications  ?Medication Sig Dispense Refill  ? atorvastatin (LIPITOR) 10 MG tablet Take 1 tablet (10 mg total) by mouth daily. 90 tablet 3  ? ibandronate (BONIVA) 150 MG tablet Take 1 tablet (150 mg total) by mouth every 30 (thirty) days. Take in the morning with a full glass of water, on an empty stomach, and do not take anything else by mouth or lie down for at least 1 hour. 3 tablet 0  ? promethazine-dextromethorphan (PROMETHAZINE-DM) 6.25-15 MG/5ML syrup Take 5 mLs by mouth at bedtime as needed for cough. (Patient not taking: Reported on 04/15/2021) 118 mL 0  ? VENTOLIN HFA 108 (90 Base) MCG/ACT inhaler INHALE 2 PUFFS INTO THE LUNGS EVERY 6 HOURS AS NEEDED FOR WHEEZING OR SHORTNESS OF BREATH 18 g 0  ? ?No current facility-administered medications for this visit.  ? ? ?No Known Allergies ? ?Family History  ?Problem Relation Age of Onset  ? Breast cancer Neg Hx   ? ? ?Social History  ? ?Socioeconomic History  ? Marital status: Married  ?  Spouse name: Not on file  ? Number of children: 3  ? Years of education: Not on file  ? Highest education level: Not on file  ?Occupational History  ? Occupation: Accounting  ?Tobacco Use  ?  Smoking status: Never  ? Smokeless tobacco: Never  ?Vaping Use  ? Vaping Use: Never used  ?Substance and Sexual Activity  ? Alcohol use: Never  ? Drug use: Never  ? Sexual activity: Not on file  ?Other Topics Concern  ? Not on file  ?Social History Narrative  ? Not on file  ? ?Social Determinants of Health  ? ?Financial Resource Strain: Low Risk   ? Difficulty of Paying Living Expenses: Not hard at all  ?Food Insecurity: No Food Insecurity  ? Worried About Programme researcher, broadcasting/film/videounning Out of Food in the Last Year: Never true  ? Ran Out of Food in the Last Year: Never true  ?Transportation Needs: No Transportation Needs  ? Lack of Transportation (Medical): No  ? Lack of Transportation (Non-Medical): No  ?Physical Activity: Inactive  ? Days of Exercise per Week: 0 days  ? Minutes of Exercise per Session: 0 min  ?Stress: No Stress Concern Present  ? Feeling of Stress : Not at all  ?Social Connections: Socially Integrated  ? Frequency of Communication with Friends and Family: More than three times a week  ? Frequency of Social Gatherings with Friends and Family: Once a week  ? Attends Religious Services: More than 4 times per year  ? Active Member of Clubs or Organizations: Yes  ?  Attends Banker Meetings: More than 4 times per year  ? Marital Status: Married  ?Intimate Partner Violence: Not on file  ? ? ? ?Constitutional: Denies fever, malaise, fatigue, headache or abrupt weight changes.  ?HEENT: Denies eye pain, eye redness, ear pain, ringing in the ears, wax buildup, runny nose, nasal congestion, bloody nose, or sore throat. ?Respiratory: Denies difficulty breathing, shortness of breath, cough or sputum production.   ?Cardiovascular: Denies chest pain, chest tightness, palpitations or swelling in the hands or feet.  ?Gastrointestinal: Denies abdominal pain, bloating, constipation, diarrhea or blood in the stool.  ?GU: Denies urgency, frequency, pain with urination, burning sensation, blood in urine, odor or  discharge. ?Musculoskeletal: Denies decrease in range of motion, difficulty with gait, muscle pain or joint pain and swelling.  ?Skin: Denies redness, rashes, lesions or ulcercations.  ?Neurological: Denies dizziness, difficulty with memory, difficulty with speech or problems with balance and coordination.  ?Psych: Denies anxiety, depression, SI/HI. ? ?No other specific complaints in a complete review of systems (except as listed in HPI above). ? ?Objective:  ? Physical Exam ? ?There were no vitals taken for this visit. ?Wt Readings from Last 3 Encounters:  ?04/10/21 149 lb (67.6 kg)  ?09/24/20 154 lb 12.8 oz (70.2 kg)  ?06/18/20 158 lb 6.4 oz (71.8 kg)  ? ? ?General: Appears their stated age, well developed, well nourished in NAD. ?Skin: Warm, dry and intact. No rashes, lesions or ulcerations noted. ?HEENT: Head: normal shape and size; Eyes: sclera white, no icterus, conjunctiva pink, PERRLA and EOMs intact; Ears: Tm's gray and intact, normal light reflex; Nose: mucosa pink and moist, septum midline; Throat/Mouth: Teeth present, mucosa pink and moist, no exudate, lesions or ulcerations noted.  ?Neck:  Neck supple, trachea midline. No masses, lumps or thyromegaly present.  ?Cardiovascular: Normal rate and rhythm. S1,S2 noted.  No murmur, rubs or gallops noted. No JVD or BLE edema. No carotid bruits noted. ?Pulmonary/Chest: Normal effort and positive vesicular breath sounds. No respiratory distress. No wheezes, rales or ronchi noted.  ?Abdomen: Soft and nontender. Normal bowel sounds. No distention or masses noted. Liver, spleen and kidneys non palpable. ?Musculoskeletal: Normal range of motion. No signs of joint swelling. No difficulty with gait.  ?Neurological: Alert and oriented. Cranial nerves II-XII grossly intact. Coordination normal.  ?Psychiatric: Mood and affect normal. Behavior is normal. Judgment and thought content normal.  ? ? ?BMET ?   ?Component Value Date/Time  ? NA 139 05/21/2020 0913  ? K 4.2  05/21/2020 0913  ? CL 105 05/21/2020 0913  ? CO2 28 05/21/2020 0913  ? GLUCOSE 87 05/21/2020 0913  ? BUN 11 05/21/2020 0913  ? CREATININE 0.85 05/21/2020 0913  ? CALCIUM 8.8 05/21/2020 0913  ? GFRNONAA 72 11/16/2019 1058  ? GFRAA 83 11/16/2019 1058  ? ? ?Lipid Panel  ?   ?Component Value Date/Time  ? CHOL 167 05/21/2020 0913  ? TRIG 128 05/21/2020 0913  ? HDL 74 05/21/2020 0913  ? CHOLHDL 2.3 05/21/2020 0913  ? LDLCALC 72 05/21/2020 0913  ? ? ?CBC ?   ?Component Value Date/Time  ? WBC 4.7 05/21/2020 0913  ? RBC 4.99 05/21/2020 0913  ? HGB 15.4 05/21/2020 0913  ? HCT 46.5 (H) 05/21/2020 0913  ? PLT 182 05/21/2020 0913  ? MCV 93.2 05/21/2020 0913  ? MCH 30.9 05/21/2020 0913  ? MCHC 33.1 05/21/2020 0913  ? RDW 12.7 05/21/2020 0913  ? LYMPHSABS 1,946 05/21/2020 0913  ? EOSABS 108 05/21/2020 0913  ?  BASOSABS 38 05/21/2020 0913  ? ? ?Hgb A1C ?No results found for: HGBA1C ? ? ? ? ? ?   ?Assessment & Plan:  ? ? ? ?[name], NP ? ?

## 2021-06-23 ENCOUNTER — Other Ambulatory Visit: Payer: Self-pay | Admitting: Internal Medicine

## 2021-06-24 NOTE — Telephone Encounter (Signed)
Requested medications are due for refill today.  yes ? ?Requested medications are on the active medications list.  yes ? ?Last refill. 04/08/2021 #3 0 refills ? ?Future visit scheduled.   no ? ?Notes to clinic.  Labs are expired. ? ? ? ?Requested Prescriptions  ?Pending Prescriptions Disp Refills  ? ibandronate (BONIVA) 150 MG tablet [Pharmacy Med Name: IBANDRONATE SODIUM 150 MG Tablet] 3 tablet 0  ?  Sig: TAKE 1TAB MONTHLY IN MORNING W/FULL GLASS OF WATER ON EMPTY STOMACH. DO NOT EAT, DRINK OR LIE DOWN FOR NEXT 1HR.  ?  ? Endocrinology:  Bisphosphonates Failed - 06/23/2021 12:01 PM  ?  ?  Failed - Ca in normal range and within 360 days  ?  Calcium  ?Date Value Ref Range Status  ?05/21/2020 8.8 8.6 - 10.4 mg/dL Final  ?  ?  ?  ?  Failed - Cr in normal range and within 360 days  ?  Creat  ?Date Value Ref Range Status  ?05/21/2020 0.85 0.60 - 0.93 mg/dL Final  ?  Comment:  ?  For patients >98 years of age, the reference limit ?for Creatinine is approximately 13% higher for people ?identified as African-American. ?. ?  ?  ?  ?  ?  Failed - Mg Level in normal range and within 360 days  ?  No results found for: MG  ?  ?  ?  Failed - Phosphate in normal range and within 360 days  ?  No results found for: PHOS  ?  ?  ?  Failed - eGFR is 30 or above and within 360 days  ?  GFR, Est African American  ?Date Value Ref Range Status  ?11/16/2019 83 > OR = 60 mL/min/1.49m Final  ? ?GFR, Est Non African American  ?Date Value Ref Range Status  ?11/16/2019 72 > OR = 60 mL/min/1.750mFinal  ?  ?  ?  ?  Passed - Vitamin D in normal range and within 360 days  ?  Vit D, 25-Hydroxy  ?Date Value Ref Range Status  ?09/24/2020 60 30 - 100 ng/mL Final  ?  Comment:  ?  Vitamin D Status         25-OH Vitamin D: ?. ?Deficiency:                    <20 ng/mL ?Insufficiency:             20 - 29 ng/mL ?Optimal:                 > or = 30 ng/mL ?. Marland KitchenFor 25-OH Vitamin D testing on patients on  ?D2-supplementation and patients for whom quantitation  ?of  D2 and D3 fractions is required, the QuestAssureD(TM) ?25-OH VIT D, (D2,D3), LC/MS/MS is recommended: order  ?code [phone] >2y33yr ?See Note 1 ?. ?Note 1 ?. ?For additional information, please refer to  ?http://education.Quest[facility].com/faq/FAQ199  ?(This link is being provided for informational/ ?educational purposes only.) ?  ?  ?  ?  ?  Passed - Valid encounter within last 12 months  ?  Recent Outpatient Visits   ? ?      ? 2 months ago [facility]  [facility] [facility]  ? 2 months [facility]  [facility]  ? 5 months ago [facility]  ? [facility]  ? 9 months ago Vitamin D  deficiency  ? [facility] [place], Coralie Keens, NP  ? 1 year ago Essential hypertension  ? [facility] [place] [facility] Kathrine Haddock, NP  ? ?  ?  ? ? ?  ?  ?  Passed - Bone Mineral Density or Dexa Scan completed in the last 2 years  ?  ?  ?  ?

## 2021-10-16 ENCOUNTER — Other Ambulatory Visit: Payer: Self-pay | Admitting: Internal Medicine

## 2021-10-16 ENCOUNTER — Ambulatory Visit: Payer: Self-pay | Admitting: *Deleted

## 2021-10-16 DIAGNOSIS — E782 Mixed hyperlipidemia: Secondary | ICD-10-CM

## 2021-10-16 NOTE — Telephone Encounter (Signed)
  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   Seeking advice on what she should do from here. Symptoms: No symptoms Frequency: Husband recently diagnosed with TB from a lump under his arm that was biopsied  Pertinent Negatives: Patient denies symptoms Disposition: [] ED /[] Urgent Care (no appt availability in office) / [] Appointment(In office/virtual)/ []  [place] [facility]/ [] Home Care/ [] Refused Recommended Disposition /[]  Mobile Bus/ [x]  Follow-up with PCP Additional Notes: Pt seeking advice on her next steps what she should do.   Agreeable to being called back

## 2021-10-16 NOTE — Telephone Encounter (Signed)
I have called [facility] Department TB line. Awaiting a call back at this time.

## 2021-10-16 NOTE — Telephone Encounter (Signed)
Pt calling in because husband has TB.  Had a lump under his arm and they think it's TB.   He has to see a specialist.   He noticed it less than 6 months ago.    Test result was positive.   No respiratory symptoms.   Reason for Disposition  [1] Caller requesting NON-URGENT health information AND [2] PCP's office is the best resource  Answer Assessment - Initial Assessment Questions 1. REASON FOR CALL or QUESTION: "What is your reason for calling today?" or "How can I best help you?" or "What question do you have that I can help answer?"     Her husband has been diagnosed with TB.   His only symptom was a lump under his arm.   The biopsy came back positive for TB.   He has an appt. To see a specialist.  Rhonda Lamb wants to know what she should do.   She is not having any symptoms but neither did her husband he just happened to feel the knot under his arm.  She is seeking further advice.  Protocols used: Information Only Call - No Triage-A-AH

## 2021-10-17 NOTE — Telephone Encounter (Signed)
Received call back from [facility] Department. I need to fax them the info that they need but this won't be don't until Monday. 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 No further action at this time if they are asymptomatic.

## 2021-10-17 NOTE — Telephone Encounter (Signed)
Requested medication (s) are due for refill today: yes  Requested medication (s) are on the active medication list: yes  Last refill:  11/29/20 #90 with 3 RF  Future visit scheduled: no, seen 04/10/21, canceled 06/03/21, NO SHOW 06/12/21  Notes to clinic:  Failed protocol of labs within 12 months, labs from 04/2020, no upcoming appt, please assess.       Requested Prescriptions  Pending Prescriptions Disp Refills   atorvastatin (LIPITOR) 10 MG tablet [Pharmacy Med Name: ATORVASTATIN CALCIUM 10 MG Tablet] 90 tablet 3    Sig: TAKE 1 TABLET EVERY DAY     Cardiovascular:  Antilipid - Statins Failed - 10/16/2021 11:09 AM      Failed - Lipid Panel in normal range within the last 12 months    Cholesterol  Date Value Ref Range Status  05/21/2020 167 <200 mg/dL Final   LDL Cholesterol (Calc)  Date Value Ref Range Status  05/21/2020 72 mg/dL (calc) Final    Comment:    Reference range: <100 . Desirable range <100 mg/dL for primary prevention;   <70 mg/dL for patients with CHD or diabetic patients  with > or = 2 CHD risk factors. Marland Kitchen LDL-C is now calculated using the Martin-Hopkins  calculation, which is a validated novel method providing  better accuracy than the Friedewald equation in the  estimation of LDL-C.  Horald Pollen et al. Lenox Ahr. 6433;295(18): 2061-2068  (http://education.Quest[facility].com/faq/FAQ164)    HDL  Date Value Ref Range Status  05/21/2020 74 > OR = 50 mg/dL Final   Triglycerides  Date Value Ref Range Status  05/21/2020 128 <150 mg/dL Final         Passed - Patient is not pregnant      Passed - Valid encounter within last 12 months    Recent Outpatient Visits           6 months ago Viral bronchitis   [facility] [place], Salvadore Oxford, NP   6 months ago Viral sinusitis   [facility] [place], Salvadore Oxford, NP   9 months ago COVID-19   [facility] [place], Salvadore Oxford, NP   1 year ago Vitamin D deficiency   [facility] [place], Salvadore Oxford, NP   1 year ago Essential hypertension   [facility] Gabriel Cirri, NP

## 2021-10-20 ENCOUNTER — Ambulatory Visit (LOCAL_COMMUNITY_HEALTH_CENTER): Payer: Self-pay

## 2021-10-20 DIAGNOSIS — Z111 Encounter for screening for respiratory tuberculosis: Secondary | ICD-10-CM

## 2021-10-20 NOTE — Progress Notes (Signed)
In nurse clinic requesting PPD skin test.  States her husband has lymphoma and recently noticed lump  under arm.  Was biopsied and told he has Tb but waiting on further followup.  Pt denies any s/s but wants PPD test and states she has never had a skin test.    PPD placed and pt will return 10/23/21 for reading.  Cherlynn Polo, RN

## 2021-10-23 ENCOUNTER — Other Ambulatory Visit: Payer: Self-pay

## 2021-10-23 ENCOUNTER — Ambulatory Visit (LOCAL_COMMUNITY_HEALTH_CENTER): Payer: Self-pay

## 2021-10-23 DIAGNOSIS — Z111 Encounter for screening for respiratory tuberculosis: Secondary | ICD-10-CM

## 2021-10-23 LAB — TB SKIN TEST: TB Skin Test: NEGATIVE

## 2021-10-23 NOTE — Progress Notes (Signed)
Erroneous entry

## 2021-10-23 NOTE — Progress Notes (Signed)
Patient here for PPD reading, negative 0 mm. Reports she made appointment since husband had bx at [facility] which was positive for mycobacteria. Patient denies symptoms, on two meds, statin and bone pill. Tested negative for HIV in 2021.Advised patient if she develops symptoms (fever, cough, night sweats, SOB) to seek medical attention.   Augustin Schooling, RN

## 2021-10-23 NOTE — Patient Instructions (Signed)
PPD reading negative, 0 mm left forearm.

## 2021-11-18 ENCOUNTER — Other Ambulatory Visit: Payer: Self-pay

## 2021-11-18 DIAGNOSIS — E782 Mixed hyperlipidemia: Secondary | ICD-10-CM

## 2021-11-25 ENCOUNTER — Other Ambulatory Visit: Payer: Self-pay

## 2021-11-25 DIAGNOSIS — E782 Mixed hyperlipidemia: Secondary | ICD-10-CM

## 2022-01-28 ENCOUNTER — Other Ambulatory Visit: Payer: Self-pay | Admitting: Internal Medicine

## 2022-01-28 DIAGNOSIS — E782 Mixed hyperlipidemia: Secondary | ICD-10-CM

## 2022-01-28 NOTE — Telephone Encounter (Signed)
Requested medication (s) are due for refill today: yes  Requested medication (s) are on the active medication list: yes  Last refill:  11/29/20 and 06/25/21  Future visit scheduled: yes  Notes to clinic:  Unable to refill per protocol due to failed labs, no updated results.      Requested Prescriptions  Pending Prescriptions Disp Refills   atorvastatin (LIPITOR) 10 MG tablet 90 tablet 3    Sig: Take 1 tablet (10 mg total) by mouth daily.     Cardiovascular:  Antilipid - Statins Failed - 01/28/2022  3:18 PM      Failed - Lipid Panel in normal range within the last 12 months    Cholesterol  Date Value Ref Range Status  05/21/2020 167 <200 mg/dL Final   LDL Cholesterol (Calc)  Date Value Ref Range Status  05/21/2020 72 mg/dL (calc) Final    Comment:    Reference range: <100 . Desirable range <100 mg/dL for primary prevention;   <70 mg/dL for patients with CHD or diabetic patients  with > or = 2 CHD risk factors. Marland Kitchen LDL-C is now calculated using the Martin-Hopkins  calculation, which is a validated novel method providing  better accuracy than the Friedewald equation in the  estimation of LDL-C.  Cresenciano Genre et al. Annamaria Helling. 8003;491(79): 2061-2068  (http://education.Quest[facility].com/faq/FAQ164)    HDL  Date Value Ref Range Status  05/21/2020 74 > OR = 50 mg/dL Final   Triglycerides  Date Value Ref Range Status  05/21/2020 128 <150 mg/dL Final         Passed - Patient is not pregnant      Passed - Valid encounter within last 12 months    Recent Outpatient Visits           9 months ago Viral bronchitis   [facility] [place], Coralie Keens, NP   9 months ago Viral sinusitis   [facility] [place], Coralie Keens, NP   1 year ago [facility] [place], Coralie Keens, NP   1 year ago Vitamin D deficiency   [facility] [place], [place] W, NP   1 year ago Essential hypertension   [place], NP       Future Appointments             In 1 week Garnette Gunner, Coralie Keens, NP [facility]             ibandronate (BONIVA) 150 MG tablet 3 tablet 0    Sig: Take in the morning with a full glass of water, on an empty stomach, and do not take anything else by mouth or lie down for the next 30 min.     Endocrinology:  Bisphosphonates Failed - 01/28/2022  3:18 PM      Failed - Ca in normal range and within 360 days    Calcium  Date Value Ref Range Status  05/21/2020 8.8 8.6 - 10.4 mg/dL Final         Failed - Vitamin D in normal range and within 360 days    Vit D, 25-Hydroxy  Date Value Ref Range Status  09/24/2020 60 30 - 100 ng/mL Final    Comment:    Vitamin D Status         25-OH Vitamin D: . Deficiency:                    <  20 ng/mL Insufficiency:             20 - 29 ng/mL Optimal:                 > or = 30 ng/mL . For 25-OH Vitamin D testing on patients on  D2-supplementation and patients for whom quantitation  of D2 and D3 fractions is required, the QuestAssureD(TM) 25-OH VIT D, (D2,D3), LC/MS/MS is recommended: order  code [phone] (patients >55yr). See Note 1 . Note 1 . For additional information, please refer to  http://education.[facility].com/faq/FAQ199  (This link is being provided for informational/ educational purposes only.)          Failed - Cr in normal range and within 360 days    Creat  Date Value Ref Range Status  05/21/2020 0.85 0.60 - 0.93 mg/dL Final    Comment:    For patients >497years of age, the reference limit for Creatinine is approximately 13% higher for people identified as African-American. .          Failed - Mg Level in normal range and within 360 days    No results found for: "MG"       Failed - Phosphate in normal range and within 360 days    No results found for: "PHOS"       Failed - eGFR is 30 or above and within 360 days    GFR, Est African American  Date Value Ref Range Status   11/16/2019 83 > OR = 60 mL/min/1.740mFinal   GFR, Est Non African American  Date Value Ref Range Status  11/16/2019 72 > OR = 60 mL/min/1.7314minal         Failed - Bone Mineral Density or Dexa Scan completed in the last 2 years      Passed - Valid encounter within last 12 months    Recent Outpatient Visits           9 months ago Viral bronchitis   [facility]   9 months ago Viral [facility]   [facility]   1 year [facility]   1 year ago Vitamin D deficiency   [facility]   1 year ago Essential hypertension   [facility]       Future Appointments             In 1 week [facility]

## 2022-01-28 NOTE — Telephone Encounter (Signed)
Medication Refill - Medication: atorvastatin (LIPITOR) 10 MG tablet and ibandronate (BONIVA) 150 MG tablet   Has the patient contacted their pharmacy? No. Pt uses mail order pharmacy  Preferred Pharmacy (with phone number or street name):  [facility] - [place], [place] - [address] Phone: [phone]  Fax: [phone]     Has the patient been seen for an appointment in the last year OR does the patient have an upcoming appointment? Yes.    Agent: Please be advised that RX refills may take up to 3 business days. We ask that you follow-up with your pharmacy.

## 2022-01-29 MED ORDER — IBANDRONATE SODIUM 150 MG PO TABS: ORAL_TABLET | ORAL | 0 refills | Status: DC

## 2022-01-29 MED ORDER — ATORVASTATIN CALCIUM 10 MG PO TABS: 10.0000 mg | ORAL_TABLET | Freq: Every day | ORAL | 0 refills | Status: DC

## 2022-02-04 ENCOUNTER — Encounter: Payer: Self-pay | Admitting: Internal Medicine

## 2022-02-04 ENCOUNTER — Ambulatory Visit (INDEPENDENT_AMBULATORY_CARE_PROVIDER_SITE_OTHER): Payer: Medicare Other | Admitting: Internal Medicine

## 2022-02-04 VITALS — BP 134/82 | HR 74 | Temp 96.8°F | Ht 64.0 in | Wt 155.0 lb

## 2022-02-04 DIAGNOSIS — F411 Generalized anxiety disorder: Secondary | ICD-10-CM

## 2022-02-04 DIAGNOSIS — E782 Mixed hyperlipidemia: Secondary | ICD-10-CM | POA: Diagnosis not present

## 2022-02-04 DIAGNOSIS — Z6826 Body mass index (BMI) 26.0-26.9, adult: Secondary | ICD-10-CM

## 2022-02-04 DIAGNOSIS — Z23 Encounter for immunization: Secondary | ICD-10-CM

## 2022-02-04 DIAGNOSIS — I10 Essential (primary) hypertension: Secondary | ICD-10-CM | POA: Diagnosis not present

## 2022-02-04 DIAGNOSIS — E663 Overweight: Secondary | ICD-10-CM

## 2022-02-04 DIAGNOSIS — M810 Age-related osteoporosis without current pathological fracture: Secondary | ICD-10-CM | POA: Diagnosis not present

## 2022-02-04 DIAGNOSIS — Z1231 Encounter for screening mammogram for malignant neoplasm of breast: Secondary | ICD-10-CM | POA: Diagnosis not present

## 2022-02-04 MED ORDER — BUSPIRONE HCL 5 MG PO TABS: 5.0000 mg | ORAL_TABLET | Freq: Every day | ORAL | 0 refills | Status: DC | PRN

## 2022-02-04 NOTE — Progress Notes (Signed)
Subjective:    Patient ID: Rhonda Lamb, female    DOB: March 17, 1945, 76 y.o.   MRN: [id number]  HPI  Patient presents to clinic today for follow-up of chronic conditions.  HTN: Her BP today is 134/82.  She is not taking any antihypertensive medications at this time.  ECG from 05/2020 reviewed.  Osteoporosis: She is taking Ibandronate as prescribed.  She tries to get 30 minutes of weightbearing exercise daily.  She is not currently taking any calcium or vitamin D OTC.  Bone density from 12/2019 reviewed.  HLD: Her last LDL was 72, triglycerides 017, 05/2018.  She denies myalgias on Atorvastatin.  She tries to consume low-fat diet.  Review of Systems     Past Medical History:  Diagnosis Date   Motion sickness    circular motion   Vertigo    none in over 1 year    Current Outpatient Medications  Medication Sig Dispense Refill   atorvastatin (LIPITOR) 10 MG tablet Take 1 tablet (10 mg total) by mouth daily. 90 tablet 0   ibandronate (BONIVA) 150 MG tablet TAKE 1TAB MONTHLY IN MORNING W/FULL GLASS OF WATER ON EMPTY STOMACH. DO NOT EAT, DRINK OR LIE DOWN FOR NEXT 1HR. 3 tablet 0   promethazine-dextromethorphan (PROMETHAZINE-DM) 6.25-15 MG/5ML syrup Take 5 mLs by mouth at bedtime as needed for cough. (Patient not taking: Reported on 04/15/2021) 118 mL 0   VENTOLIN HFA 108 (90 Base) MCG/ACT inhaler INHALE 2 PUFFS INTO THE LUNGS EVERY 6 HOURS AS NEEDED FOR WHEEZING OR SHORTNESS OF BREATH 18 g 0   No current facility-administered medications for this visit.    No Known Allergies  Family History  Problem Relation Age of Onset   Breast cancer Neg Hx     Social History   Socioeconomic History   Marital status: Married    Spouse name: Not on file   Number of children: 3   Years of education: Not on file   Highest education level: Not on file  Occupational History   Occupation: Accounting  Tobacco Use   Smoking status: Never   Smokeless tobacco: Never  Vaping Use   Vaping  Use: Never used  Substance and Sexual Activity   Alcohol use: Never   Drug use: Never   Sexual activity: Not on file  Other Topics Concern   Not on file  Social History Narrative   Not on file   Social Determinants of Health   Financial Resource Strain: Low Risk  (04/15/2021)   Overall Financial Resource Strain (CARDIA)    Difficulty of Paying Living Expenses: Not hard at all  Food Insecurity: No Food Insecurity (04/15/2021)   Hunger Vital Sign    Worried About Running Out of Food in the Last Year: Never true    Ran Out of Food in the Last Year: Never true  Transportation Needs: No Transportation Needs (04/15/2021)   PRAPARE - Administrator, Civil Service (Medical): No    Lack of Transportation (Non-Medical): No  Physical Activity: Inactive (04/15/2021)   Exercise Vital Sign    Days of Exercise per Week: 0 days    Minutes of Exercise per Session: 0 min  Stress: No Stress Concern Present (04/15/2021)   [employer] of Occupational Health - Occupational Stress Questionnaire    Feeling of Stress : Not at all  Social Connections: Socially Integrated (04/15/2021)   Social Connection and Isolation Panel [NHANES]    Frequency of Communication with Friends and Family: More  than three times a week    Frequency of Social Gatherings with Friends and Family: Once a week    Attends Religious Services: More than 4 times per year    Active Member of [employer] or Organizations: Yes    Attends Engineer, structural: More than 4 times per year    Marital Status: Married  Catering manager Violence: Not on file     Constitutional: Denies fever, malaise, fatigue, headache or abrupt weight changes.  HEENT: Denies eye pain, eye redness, ear pain, ringing in the ears, wax buildup, runny nose, nasal congestion, bloody nose, or sore throat. Respiratory: Denies difficulty breathing, shortness of breath, cough or sputum production.   Cardiovascular: Pt reports intermittent  palpitations, chest tightness. Denies chest pain, or swelling in the hands or feet.  Gastrointestinal: Denies abdominal pain, bloating, constipation, diarrhea or blood in the stool.  GU: Denies urgency, frequency, pain with urination, burning sensation, blood in urine, odor or discharge. Musculoskeletal: Denies decrease in range of motion, difficulty with gait, muscle pain or joint pain and swelling.  Skin: Denies redness, rashes, lesions or ulcercations.  Neurological: Pt reports paresthesia in upper extremities. Denies dizziness, difficulty with memory, difficulty with speech or problems with balance and coordination.  Psych: Pt reports stress. Denies anxiety, depression, SI/HI.  No other specific complaints in a complete review of systems (except as listed in HPI above).  Objective:   Physical Exam  BP 134/82 (BP Location: Left Arm, Patient Position: Sitting, Cuff Size: Normal)   Pulse 74   Temp (!) 96.8 F (36 C) (Temporal)   Ht 5\' 4"  (1.626 m)   Wt 155 lb (70.3 kg)   SpO2 100%   BMI 26.61 kg/m   Wt Readings from Last 3 Encounters:  04/10/21 149 lb (67.6 kg)  09/24/20 154 lb 12.8 oz (70.2 kg)  06/18/20 158 lb 6.4 oz (71.8 kg)    General: Appears her stated age, overweight, in NAD. Skin: Warm, dry and intact.  HEENT: Head: normal shape and size; Eyes: sclera white, no icterus, conjunctiva pink, PERRLA and EOMs intact;  Cardiovascular: Normal rate and rhythm. S1,S2 noted.  No murmur, rubs or gallops noted. No JVD or BLE edema. No carotid bruits noted. Pulmonary/Chest: Normal effort and positive vesicular breath sounds. No respiratory distress. No wheezes, rales or ronchi noted.  Musculoskeletal: No difficulty with gait.  Neurological: Alert and oriented. Coordination normal.  Psychiatric: Mood and affect normal. Behavior is normal. Judgment and thought content normal.    BMET    Component Value Date/Time   NA 139 05/21/2020 0913   K 4.2 05/21/2020 0913   CL 105  05/21/2020 0913   CO2 28 05/21/2020 0913   GLUCOSE 87 05/21/2020 0913   BUN 11 05/21/2020 0913   CREATININE 0.85 05/21/2020 0913   CALCIUM 8.8 05/21/2020 0913   GFRNONAA 72 11/16/2019 1058   GFRAA 83 11/16/2019 1058    Lipid Panel     Component Value Date/Time   CHOL 167 05/21/2020 0913   TRIG 128 05/21/2020 0913   HDL 74 05/21/2020 0913   CHOLHDL 2.3 05/21/2020 0913   LDLCALC 72 05/21/2020 0913    CBC    Component Value Date/Time   WBC 4.7 05/21/2020 0913   RBC 4.99 05/21/2020 0913   HGB 15.4 05/21/2020 0913   HCT 46.5 (H) 05/21/2020 0913   PLT 182 05/21/2020 0913   MCV 93.2 05/21/2020 0913   MCH 30.9 05/21/2020 0913   MCHC 33.1 05/21/2020 0913  RDW 12.7 05/21/2020 0913   LYMPHSABS 1,946 05/21/2020 0913   EOSABS 108 05/21/2020 0913   BASOSABS 38 05/21/2020 0913    Hgb A1C No results found for: "HGBA1C"         Assessment & Plan:    RTC in 6 months for follow-up of chronic conditions [name], NP

## 2022-02-04 NOTE — Assessment & Plan Note (Signed)
Encourage diet and exercise for weight loss 

## 2022-02-04 NOTE — Assessment & Plan Note (Signed)
Controlled off meds Reinforced DASH diet and exercise weight loss 

## 2022-02-04 NOTE — Assessment & Plan Note (Signed)
C-Met and lipid profile today Encouraged her to consume a low-fat diet Continue atorvastatin

## 2022-02-04 NOTE — Patient Instructions (Signed)

## 2022-02-04 NOTE — Assessment & Plan Note (Signed)
We will trial buspirone 5 mg daily Support offered

## 2022-02-04 NOTE — Assessment & Plan Note (Signed)
Continue ibandronate Encourage daily weightbearing exercise

## 2022-02-05 LAB — CBC
HCT: 43.7 % (ref 35.0–45.0)
Hemoglobin: 14.9 g/dL (ref 11.7–15.5)
MCH: 31.3 pg (ref 27.0–33.0)
MCHC: 34.1 g/dL (ref 32.0–36.0)
MCV: 91.8 fL (ref 80.0–100.0)
MPV: 11.8 fL (ref 7.5–12.5)
Platelets: 160 10*3/uL (ref 140–400)
RBC: 4.76 10*6/uL (ref 3.80–5.10)
RDW: 12 % (ref 11.0–15.0)
WBC: 4.7 10*3/uL (ref 3.8–10.8)

## 2022-02-05 LAB — COMPLETE METABOLIC PANEL WITH GFR
AG Ratio: 1.8 (calc) (ref 1.0–2.5)
ALT: 8 U/L (ref 6–29)
AST: 18 U/L (ref 10–35)
Albumin: 4.4 g/dL (ref 3.6–5.1)
Alkaline phosphatase (APISO): 50 U/L (ref 37–153)
BUN: 15 mg/dL (ref 7–25)
CO2: 26 mmol/L (ref 20–32)
Calcium: 8.7 mg/dL (ref 8.6–10.4)
Chloride: 105 mmol/L (ref 98–110)
Creat: 1 mg/dL (ref 0.60–1.00)
Globulin: 2.5 g/dL (calc) (ref 1.9–3.7)
Glucose, Bld: 96 mg/dL (ref 65–99)
Potassium: 4 mmol/L (ref 3.5–5.3)
Sodium: 140 mmol/L (ref 135–146)
Total Bilirubin: 0.6 mg/dL (ref 0.2–1.2)
Total Protein: 6.9 g/dL (ref 6.1–8.1)
eGFR: 59 mL/min/{1.73_m2} — ABNORMAL LOW (ref >=60)

## 2022-02-05 LAB — LIPID PANEL
Cholesterol: 163 mg/dL (ref <200)
HDL: 79 mg/dL (ref >=50)
LDL Cholesterol (Calc): 68 mg/dL (calc)
Non-HDL Cholesterol (Calc): 84 mg/dL (calc) (ref <130)
Total CHOL/HDL Ratio: 2.1 (calc) (ref <5.0)
Triglycerides: 80 mg/dL (ref <150)

## 2022-02-17 ENCOUNTER — Ambulatory Visit
Admission: RE | Admit: 2022-02-17 | Discharge: 2022-02-17 | Disposition: A | Discharge status: Home / Self Care | Payer: Medicare Other | Source: Ambulatory Visit | Attending: Internal Medicine | Admitting: Internal Medicine

## 2022-02-17 DIAGNOSIS — Z1231 Encounter for screening mammogram for malignant neoplasm of breast: Secondary | ICD-10-CM | POA: Diagnosis present

## 2022-04-30 ENCOUNTER — Other Ambulatory Visit: Payer: Self-pay | Admitting: Internal Medicine

## 2022-04-30 DIAGNOSIS — E782 Mixed hyperlipidemia: Secondary | ICD-10-CM

## 2022-04-30 NOTE — Telephone Encounter (Signed)
Requested Prescriptions  Pending Prescriptions Disp Refills   atorvastatin (LIPITOR) 10 MG tablet [Pharmacy Med Name: ATORVASTATIN CALCIUM 10 MG Tablet] 90 tablet 1    Sig: TAKE 1 TABLET EVERY DAY     Cardiovascular:  Antilipid - Statins Failed - 04/30/2022 10:32 AM      Failed - Lipid Panel in normal range within the last 12 months    Cholesterol  Date Value Ref Range Status  02/04/2022 163 <200 mg/dL Final   LDL Cholesterol (Calc)  Date Value Ref Range Status  02/04/2022 68 mg/dL (calc) Final    Comment:    Reference range: <100 . Desirable range <100 mg/dL for primary prevention;   <70 mg/dL for patients with CHD or diabetic patients  with > or = 2 CHD risk factors. Marland Kitchen LDL-C is now calculated using the Martin-Hopkins  calculation, which is a validated novel method providing  better accuracy than the Friedewald equation in the  estimation of LDL-C.  Cresenciano Genre et al. Annamaria Helling. MU:[id number]): 2061-2068  (http://education.[facility].com/faq/FAQ164)    HDL  Date Value Ref Range Status  02/04/2022 79 > OR = 50 mg/dL Final   Triglycerides  Date Value Ref Range Status  02/04/2022 80 <150 mg/dL Final         Passed - Patient is not pregnant      Passed - Valid encounter within last 12 months    Recent Outpatient Visits           2 months ago Need for influenza vaccination   [facility] [place], Coralie Keens, NP   1 year ago Viral bronchitis   [facility] [place], Coralie Keens, NP   1 year ago Viral sinusitis   [facility] [place], Coralie Keens, NP   1 year ago [facility] [place] [place], Coralie Keens, NP   1 year ago Vitamin D deficiency   [facility] [place], Coralie Keens, NP       Future Appointments             In 3 months Baity, Coralie Keens, NP [facility]             ibandronate (BONIVA) 150 MG tablet  [Pharmacy Med Name: IBANDRONATE SODIUM 150 MG Tablet] 4 tablet 0    Sig: TAKE 1 TAB MONTHLY IN MORNING WITH FULL GLASS OF WATER ON EMPTY STOMACH. DO NOT EAT, DRINK OR LIE DOWN FOR NEXT 1 HOUR.     Endocrinology:  Bisphosphonates Failed - 04/30/2022 10:32 AM      Failed - Vitamin D in normal range and within 360 days    Vit D, 25-Hydroxy  Date Value Ref Range Status  09/24/2020 60 30 - 100 ng/mL Final    Comment:    Vitamin D Status         25-OH Vitamin D: . Deficiency:                    <20 ng/mL Insufficiency:             20 - 29 ng/mL Optimal:                 > or = 30 ng/mL . For 25-OH Vitamin D testing on patients on  D2-supplementation and patients for whom quantitation  of D2 and D3 fractions is required, the [facility](TM) 25-OH VIT D, (  D2,D3), LC/MS/MS is recommended: order  code [id number] (patients >67yr). See Note 1 . Note 1 . For additional information, please refer to  http://education.[facility].com/faq/FAQ199  (This link is being provided for informational/ educational purposes only.)          Failed - Mg Level in normal range and within 360 days    No results found for: "MG"       Failed - Phosphate in normal range and within 360 days    No results found for: "PHOS"       Failed - Bone Mineral Density or Dexa Scan completed in the last 2 years      Passed - Ca in normal range and within 360 days    Calcium  Date Value Ref Range Status  02/04/2022 8.7 8.6 - 10.4 mg/dL Final         Passed - Cr in normal range and within 360 days    Creat  Date Value Ref Range Status  02/04/2022 1.00 0.60 - 1.00 mg/dL Final         Passed - eGFR is 30 or above and within 360 days    GFR, Est African American  Date Value Ref Range Status  11/16/2019 83 > OR = 60 mL/min/1.749mFinal   GFR, Est Non African American  Date Value Ref Range Status  11/16/2019 72 > OR = 60 mL/min/1.7372minal   eGFR  Date Value Ref Range Status  02/04/2022 59 (L) > OR = 60  mL/min/1.75m51mnal         Passed - Valid encounter within last 12 months    Recent Outpatient Visits           2 months ago [facility] for influenza [facility]   1 year ago [facility] bronch[facility]   1 year [facility] sinusitis   [facility]   [facility]   1 year ago Vitamin D deficiency   [facility]       Future Appointments             In 3 months Baity, [facility]

## 2022-05-06 ENCOUNTER — Other Ambulatory Visit: Payer: Self-pay | Admitting: Internal Medicine

## 2022-05-06 NOTE — Telephone Encounter (Signed)
Requested Prescriptions  Pending Prescriptions Disp Refills   busPIRone (BUSPAR) 5 MG tablet [Pharmacy Med Name: BUSPIRONE HYDROCHLORIDE 5 MG Tablet] 90 tablet 3    Sig: TAKE 1 TABLET EVERY DAY AS NEEDED     Psychiatry: Anxiolytics/Hypnotics - Non-controlled Passed - 05/06/2022 10:21 AM      Passed - Valid encounter within last 12 months    Recent Outpatient Visits           3 months ago Need for influenza vaccination   [facility], Coralie Keens, NP   1 year ago Viral bronchitis   [facility] [place], Coralie Keens, NP   1 year ago Viral sinusitis   [facility], Coralie Keens, NP   1 year ago [facility] [place], Coralie Keens, NP   1 year ago Vitamin D deficiency   [facility] [place], Coralie Keens, NP       Future Appointments             In 3 months Baity, Coralie Keens, NP [facility]

## 2022-07-09 ENCOUNTER — Telehealth: Payer: Self-pay | Admitting: Internal Medicine

## 2022-07-09 NOTE — Telephone Encounter (Signed)
Called patient to schedule Medicare Annual Wellness Visit (AWV). Left message for patient to call back and schedule Medicare Annual Wellness Visit (AWV).  Last date of AWV: 04/15/21  Please schedule an appointment at any time with Kennedy Bucker, LPN   .  If any questions, please contact me.  Thank you ,  Verlee Rossetti; Care Guide Ambulatory Clinical Support [place] [facility] Direct Dial: [phone]

## 2022-08-06 ENCOUNTER — Encounter: Payer: Self-pay | Admitting: Internal Medicine

## 2022-08-06 ENCOUNTER — Ambulatory Visit (INDEPENDENT_AMBULATORY_CARE_PROVIDER_SITE_OTHER): Payer: Medicare Other | Admitting: Internal Medicine

## 2022-08-06 VITALS — BP 128/84 | HR 70 | Temp 96.8°F | Wt 158.0 lb

## 2022-08-06 DIAGNOSIS — R7309 Other abnormal glucose: Secondary | ICD-10-CM

## 2022-08-06 DIAGNOSIS — M810 Age-related osteoporosis without current pathological fracture: Secondary | ICD-10-CM | POA: Diagnosis not present

## 2022-08-06 DIAGNOSIS — F411 Generalized anxiety disorder: Secondary | ICD-10-CM

## 2022-08-06 DIAGNOSIS — E663 Overweight: Secondary | ICD-10-CM

## 2022-08-06 DIAGNOSIS — Z6827 Body mass index (BMI) 27.0-27.9, adult: Secondary | ICD-10-CM

## 2022-08-06 DIAGNOSIS — I10 Essential (primary) hypertension: Secondary | ICD-10-CM

## 2022-08-06 DIAGNOSIS — E782 Mixed hyperlipidemia: Secondary | ICD-10-CM | POA: Diagnosis not present

## 2022-08-06 NOTE — Patient Instructions (Signed)

## 2022-08-06 NOTE — Assessment & Plan Note (Signed)
Ibandronate DC'd Advised her start taking calcium and vitamin D OTC Encouraged daily weightbearing exercise

## 2022-08-06 NOTE — Progress Notes (Signed)
Subjective:    Patient ID: Rhonda Lamb, female    DOB: 1945-08-04, 77 y.o.   MRN: [id number]  HPI  Patient presents to clinic today for 64-month follow-up of chronic conditions.  HTN: Her BP today is 128/84.  She is not taking any antihypertensive medications at this time.  ECG from 05/2020 reviewed.  Osteoporosis: She no longer takes Ibandronate as prescribed because she needs to have teeth pulled.  She is not taking any Calcium or Vitamin D OTC.  She tries to get weightbearing exercise.  Bone density from 12/2019 reviewed.  HLD: Her last LDL was 68, triglycerides 80, 01/2022.  She denies myalgias on Atorvastatin.  She tries to consume low-fat diet.  Anxiety: Intermittent, managed on Buspirone as needed.  She is not currently seeing a therapist.  She denies depression, SI/HI.  Review of Systems     Past Medical History:  Diagnosis Date   Motion sickness    circular motion   Vertigo    none in over 1 year    Current Outpatient Medications  Medication Sig Dispense Refill   atorvastatin (LIPITOR) 10 MG tablet TAKE 1 TABLET EVERY DAY 90 tablet 1   busPIRone (BUSPAR) 5 MG tablet TAKE 1 TABLET EVERY DAY AS NEEDED 90 tablet 0   ibandronate (BONIVA) 150 MG tablet TAKE 1 TAB MONTHLY IN MORNING WITH FULL GLASS OF WATER ON EMPTY STOMACH. DO NOT EAT, DRINK OR LIE DOWN FOR NEXT 1 HOUR. 4 tablet 0   No current facility-administered medications for this visit.    No Known Allergies  Family History  Problem Relation Age of Onset   Breast cancer Neg Hx     Social History   Socioeconomic History   Marital status: Married    Spouse name: Not on file   Number of children: 3   Years of education: Not on file   Highest education level: Not on file  Occupational History   Occupation: Accounting  Tobacco Use   Smoking status: Never   Smokeless tobacco: Never  Vaping Use   Vaping Use: Never used  Substance and Sexual Activity   Alcohol use: Never   Drug use: Never   Sexual  activity: Not on file  Other Topics Concern   Not on file  Social History Narrative   Not on file   Social Determinants of Health   Financial Resource Strain: Low Risk  (04/15/2021)   Overall Financial Resource Strain (CARDIA)    Difficulty of Paying Living Expenses: Not hard at all  Food Insecurity: No Food Insecurity (04/15/2021)   Hunger Vital Sign    Worried About Running Out of Food in the Last Year: Never true    Ran Out of Food in the Last Year: Never true  Transportation Needs: No Transportation Needs (04/15/2021)   PRAPARE - Administrator, Civil Service (Medical): No    Lack of Transportation (Non-Medical): No  Physical Activity: Inactive (04/15/2021)   Exercise Vital Sign    Days of Exercise per Week: 0 days    Minutes of Exercise per Session: 0 min  Stress: No Stress Concern Present (04/15/2021)   [employer] of Occupational Health - Occupational Stress Questionnaire    Feeling of Stress : Not at all  Social Connections: Socially Integrated (04/15/2021)   Social Connection and Isolation Panel [NHANES]    Frequency of Communication with Friends and Family: More than three times a week    Frequency of Social Gatherings with Friends and  Family: Once a week    Attends Religious Services: More than 4 times per year    Active Member of Clubs or Organizations: Yes    Attends Engineer, structural: More than 4 times per year    Marital Status: Married  Catering manager Violence: Not on file     Constitutional: Denies fever, malaise, fatigue, headache or abrupt weight changes.  HEENT: Denies eye pain, eye redness, ear pain, ringing in the ears, wax buildup, runny nose, nasal congestion, bloody nose, or sore throat. Respiratory: Denies difficulty breathing, shortness of breath, cough or sputum production.   Cardiovascular: Denies chest pain, chest tightness, palpitations or swelling in the hands or feet.  Gastrointestinal: Denies abdominal pain,  bloating, constipation, diarrhea or blood in the stool.  GU: Denies urgency, frequency, pain with urination, burning sensation, blood in urine, odor or discharge. Musculoskeletal: Denies decrease in range of motion, difficulty with gait, muscle pain or joint pain and swelling.  Skin: Denies redness, rashes, lesions or ulcercations.  Neurological: Denies dizziness, difficulty with memory, difficulty with speech or problems with balance and coordination.  Psych: Patient has a history of anxiety.  Denies depression, SI/HI.  No other specific complaints in a complete review of systems (except as listed in HPI above).  Objective:   Physical Exam   BP 128/84 (BP Location: Left Arm, Patient Position: Sitting, Cuff Size: Normal)   Pulse 70   Temp (!) 96.8 F (36 C) (Temporal)   Wt 158 lb (71.7 kg)   SpO2 97%   BMI 27.12 kg/m   Wt Readings from Last 3 Encounters:  02/04/22 155 lb (70.3 kg)  04/10/21 149 lb (67.6 kg)  09/24/20 154 lb 12.8 oz (70.2 kg)    General: Appears her stated age, overweight, in NAD. Skin: Warm, dry and intact.  HEENT: Head: normal shape and size; Eyes: sclera white, no icterus, conjunctiva pink, PERRLA and EOMs intact;  Cardiovascular: Normal rate and rhythm. S1,S2 noted.  No murmur, rubs or gallops noted. No JVD or BLE edema. No carotid bruits noted. Pulmonary/Chest: Normal effort and positive vesicular breath sounds. No respiratory distress. No wheezes, rales or ronchi noted.  Musculoskeletal:  No difficulty with gait.  Neurological: Alert and oriented. Coordination normal.  Psychiatric: Mood and affect normal. Behavior is normal. Judgment and thought content normal.    BMET    Component Value Date/Time   NA 140 02/04/2022 0944   K 4.0 02/04/2022 0944   CL 105 02/04/2022 0944   CO2 26 02/04/2022 0944   GLUCOSE 96 02/04/2022 0944   BUN 15 02/04/2022 0944   CREATININE 1.00 02/04/2022 0944   CALCIUM 8.7 02/04/2022 0944   GFRNONAA 72 11/16/2019 1058    GFRAA 83 11/16/2019 1058    Lipid Panel     Component Value Date/Time   CHOL 163 02/04/2022 0944   TRIG 80 02/04/2022 0944   HDL 79 02/04/2022 0944   CHOLHDL 2.1 02/04/2022 0944   LDLCALC 68 02/04/2022 0944    CBC    Component Value Date/Time   WBC 4.7 02/04/2022 0944   RBC 4.76 02/04/2022 0944   HGB 14.9 02/04/2022 0944   HCT 43.7 02/04/2022 0944   PLT 160 02/04/2022 0944   MCV 91.8 02/04/2022 0944   MCH 31.3 02/04/2022 0944   MCHC 34.1 02/04/2022 0944   RDW 12.0 02/04/2022 0944   LYMPHSABS 1,946 05/21/2020 0913   EOSABS 108 05/21/2020 0913   BASOSABS 38 05/21/2020 0913    Hgb A1C No results  found for: "HGBA1C"        Assessment & Plan:      RTC in 6 months, follow-up chronic conditions [name], NP

## 2022-08-06 NOTE — Assessment & Plan Note (Signed)
Continue buspirone as needed Support offered

## 2022-08-06 NOTE — Assessment & Plan Note (Signed)
C-Met and lipid profile today Encouraged to consume low-fat diet Continue atorvastatin 

## 2022-08-06 NOTE — Assessment & Plan Note (Signed)
Controlled off meds Reinforced DASH diet and exercise for weight loss 

## 2022-08-06 NOTE — Assessment & Plan Note (Signed)
Encouraged diet and exercise for weight loss ?

## 2022-08-07 LAB — CBC
HCT: 43 % (ref 35.0–45.0)
Hemoglobin: 14.3 g/dL (ref 11.7–15.5)
MCH: 30.5 pg (ref 27.0–33.0)
MCHC: 33.3 g/dL (ref 32.0–36.0)
MCV: 91.7 fL (ref 80.0–100.0)
MPV: 11.1 fL (ref 7.5–12.5)
Platelets: 209 10*3/uL (ref 140–400)
RBC: 4.69 10*6/uL (ref 3.80–5.10)
RDW: 12.3 % (ref 11.0–15.0)
WBC: 4.8 10*3/uL (ref 3.8–10.8)

## 2022-08-07 LAB — COMPLETE METABOLIC PANEL WITH GFR
AG Ratio: 2 (calc) (ref 1.0–2.5)
ALT: 13 U/L (ref 6–29)
AST: 16 U/L (ref 10–35)
Albumin: 4.3 g/dL (ref 3.6–5.1)
Alkaline phosphatase (APISO): 50 U/L (ref 37–153)
BUN: 14 mg/dL (ref 7–25)
CO2: 27 mmol/L (ref 20–32)
Calcium: 9 mg/dL (ref 8.6–10.4)
Chloride: 106 mmol/L (ref 98–110)
Creat: 0.85 mg/dL (ref 0.60–1.00)
Globulin: 2.2 g/dL (calc) (ref 1.9–3.7)
Glucose, Bld: 91 mg/dL (ref 65–139)
Potassium: 4.4 mmol/L (ref 3.5–5.3)
Sodium: 139 mmol/L (ref 135–146)
Total Bilirubin: 0.5 mg/dL (ref 0.2–1.2)
Total Protein: 6.5 g/dL (ref 6.1–8.1)
eGFR: 72 mL/min/{1.73_m2} (ref >=60)

## 2022-08-07 LAB — LIPID PANEL
Cholesterol: 138 mg/dL (ref <200)
HDL: 65 mg/dL (ref >=50)
LDL Cholesterol (Calc): 52 mg/dL (calc)
Non-HDL Cholesterol (Calc): 73 mg/dL (calc) (ref <130)
Total CHOL/HDL Ratio: 2.1 (calc) (ref <5.0)
Triglycerides: 119 mg/dL (ref <150)

## 2022-08-07 LAB — HEMOGLOBIN A1C
Hgb A1c MFr Bld: 5.7 % of total Hgb — ABNORMAL HIGH (ref <5.7)
Mean Plasma Glucose: 117 mg/dL
eAG (mmol/L): 6.5 mmol/L

## 2022-08-19 ENCOUNTER — Ambulatory Visit: Payer: Self-pay | Admitting: *Deleted

## 2022-08-19 NOTE — Telephone Encounter (Signed)
Chest congestion, diarrhea   The patient called in stating she has had a cough for a little over a week now as well as chest congestion, diarrhea, and lethargy. She says it has all coincided together. Please assist patient further     Chief Complaint:Cough, Diarrhea. Symptoms: Watery diarrhea x 1-1/2 weeks, 3 times daily. "And squirts when I cough." Productive cough, thick white.Fatigued, no appetite. Traveled to [place] and [place], returned 08/02/22.  Has not tested for covid. Frequency: 1 1/2 weeks Pertinent Negatives: Patient denies fever, no antibiotics Disposition: [] ED /[] Urgent Care (no appt availability in office) / [x] Appointment(In office/virtual)/ []  [facility]/ [] Home Care/ [] Refused Recommended Disposition /[] [place] [facility]/ []  Follow-up with PCP Additional Notes: No availability,attempted to reach out to practice, extended ring.  Appt secured with Erin tomorrow AM at [facility]. Care advise provided, pt verbalizes understanding.   Reason for Disposition  [1] Mild diarrhea (e.g., 1-3 or more stools than normal in past 24 hours) without known cause AND [2] present >  7 days  Answer Assessment - Initial Assessment Questions 1. DIARRHEA SEVERITY: "How bad is the diarrhea?" "How many more stools have you had in the past 24 hours than normal?"    - NO DIARRHEA (SCALE 0)   - MILD (SCALE 1-3): Few loose or mushy BMs; increase of 1-3 stools over normal daily number of stools; mild increase in ostomy output.   -  MODERATE (SCALE 4-7): Increase of 4-6 stools daily over normal; moderate increase in ostomy output.   -  SEVERE (SCALE 8-10; OR "WORST POSSIBLE"): Increase of 7 or more stools daily over normal; moderate increase in ostomy output; incontinence.     2-3 times 2. ONSET: "When did the diarrhea begin?"      1 week ago 3. BM CONSISTENCY: "How loose or watery is the diarrhea?"      watery 4. VOMITING: "Are you also vomiting?" If Yes, ask: "How many times in the past 24  hours?"      Nausea, no vomiting 5. ABDOMEN PAIN: "Are you having any abdomen pain?" If Yes, ask: "What does it feel like?" (e.g., crampy, dull, intermittent, constant)      no 6. ABDOMEN PAIN SEVERITY: If present, ask: "How bad is the pain?"  (e.g., Scale 1-10; mild, moderate, or severe)   - MILD (1-3): doesn't interfere with normal activities, abdomen soft and not tender to touch    - MODERATE (4-7): interferes with normal activities or awakens from sleep, abdomen tender to touch    - SEVERE (8-10): excruciating pain, doubled over, unable to do any normal activities     NA 7. ORAL INTAKE: If vomiting, "Have you been able to drink liquids?" "How much liquids have you had in the past 24 hours?"     NA 8. HYDRATION: "Any signs of dehydration?" (e.g., dry mouth [not just dry lips], too weak to stand, dizziness, new weight loss) "When did you last urinate?"     no 9. EXPOSURE: "Have you traveled to a foreign country recently?" "Have you been exposed to anyone with diarrhea?" "Could you have eaten any food that was spoiled?" [place], [place]  returned June 2 10. ANTIBIOTIC USE: "Are you taking antibiotics now or have you taken antibiotics in the past 2 months?"       no 11. OTHER SYMPTOMS: "Do you have any other symptoms?" (e.g., fever, blood in stool)       Cough, thick clear, no energy, no appetite.  Protocols used: [facility]

## 2022-08-20 ENCOUNTER — Ambulatory Visit (INDEPENDENT_AMBULATORY_CARE_PROVIDER_SITE_OTHER): Payer: Medicare Other | Admitting: Physician Assistant

## 2022-08-20 ENCOUNTER — Encounter: Payer: Self-pay | Admitting: Physician Assistant

## 2022-08-20 VITALS — BP 125/83 | HR 73 | Temp 97.9°F | Wt 155.8 lb

## 2022-08-20 DIAGNOSIS — J019 Acute sinusitis, unspecified: Secondary | ICD-10-CM

## 2022-08-20 MED ORDER — AMOXICILLIN-POT CLAVULANATE 875-125 MG PO TABS
1.0000 | ORAL_TABLET | Freq: Two times a day (BID) | ORAL | 0 refills | Status: DC
Start: 2022-08-20 — End: 2022-11-20

## 2022-08-20 MED ORDER — BENZONATATE 100 MG PO CAPS
100.0000 mg | ORAL_CAPSULE | Freq: Two times a day (BID) | ORAL | 0 refills | Status: DC | PRN
Start: 2022-08-20 — End: 2022-11-20

## 2022-08-20 NOTE — Patient Instructions (Addendum)
Based on your symptoms and duration of illness, I believe you may have a bacterial sinus infection  These typically resolve with antibiotic therapy along with at-home comfort measures  Today I have sent in a prescription for Augmentin 875-125 mg to be taken by mouth twice per day for 10 days  FINISH THE ENTIRE COURSE unless you develop an allergic reaction or are instructed to discontinue.  It can take a few days for the antibiotic to kick in so I recommend symptomatic relief with over the counter medication such as the following:  Dayquil/ Nyquil Theraflu Alkaseltzer   These medications typically have Tylenol in them already so you can take Ibuprofen as needed for further pain/ discomfort and fever management/ do not need to supplement with more outside of those medications  If preferred you can use regular formulations of Robitussin and Mucinex to help with cough and nasal congestion respectively   Stay well hydrated with at least 75 oz of water per day to help with recovery  If you notice any of the following please let us know: increased fever not responding to Tylenol or Ibuprofen, swelling around your nose or eyes, difficulty seeing

## 2022-08-20 NOTE — Progress Notes (Signed)
Acute Office Visit   [name]   DOB: [birth year]   77 y.o. Female  MRN: [id number] Visit Date: 08/20/2022  Today's healthcare provider: Oswaldo Conroy Abdalla Naramore, PA-C  Introduced myself to the patient as a Secondary school teacher and provided education on APPs in clinical practice.    Chief Complaint  Patient presents with   URI    Pt states she has been having a productive cough, fatigue, decreased appetite, sinus pressure, and headache. States she has been having these symptoms for the last 12 to 14 days. States she has been using OTC allergy medication that did not seem to help the symptoms.    Subjective    HPI HPI     URI    Additional comments: Pt states she has been having a productive cough, fatigue, decreased appetite, sinus pressure, and headache. States she has been having these symptoms for the last 12 to 14 days. States she has been using OTC allergy medication that did not seem to help the symptoms.       Last edited by Pablo Ledger, CMA on 08/20/2022  9:22 AM.       URI- Type symptoms  Onset: gradual  Duration: started 2 weeks ago with sore throat then progressed to productive coughing Associated Symptoms: nasal congestion, chest congestion, diarrhea, reduced appetite   Interventions: OTC allergy meds but not consistently   Recent sick contacts: not to her knowledge   Recent travel: yes was in [place] from May 19- June 2   COVID testing at home: has not tested at home        Medications: Outpatient Medications Prior to Visit  Medication Sig   atorvastatin (LIPITOR) 10 MG tablet TAKE 1 TABLET EVERY DAY   busPIRone (BUSPAR) 5 MG tablet TAKE 1 TABLET EVERY DAY AS NEEDED (Patient not taking: Reported on 08/20/2022)   No facility-administered medications prior to visit.    Review of Systems  Constitutional:  Positive for fatigue. Negative for chills and fever.  HENT:  Positive for congestion, postnasal drip, rhinorrhea and sore throat.    Respiratory:  Positive for cough and wheezing. Negative for shortness of breath.   Gastrointestinal:  Positive for diarrhea. Negative for nausea and vomiting.  Neurological:  Positive for dizziness (intermittent and at the beginning). Negative for headaches.       Objective    BP 125/83 (BP Location: Left Arm, Cuff Size: Normal)   Pulse 73   Temp 97.9 F (36.6 C) (Oral)   Wt 155 lb 12.8 oz (70.7 kg)   SpO2 98%   BMI 26.74 kg/m    Physical Exam Vitals reviewed.  Constitutional:      General: She is awake.     Appearance: Normal appearance. She is well-developed and well-groomed.  HENT:     Head: Normocephalic and atraumatic.     Right Ear: There is impacted cerumen.     Left Ear: Hearing, tympanic membrane and ear canal normal.     Mouth/Throat:     Lips: Pink.     Mouth: Mucous membranes are moist. No oral lesions.     Pharynx: Oropharynx is clear. Uvula midline. No oropharyngeal exudate or posterior oropharyngeal erythema.  Eyes:     General: Lids are normal. Gaze aligned appropriately.     Extraocular Movements: Extraocular movements intact.     Conjunctiva/sclera: Conjunctivae normal.     Pupils: Pupils are equal, round, and reactive to light.  Cardiovascular:  Rate and Rhythm: Normal rate and regular rhythm.     Pulses: No decreased pulses.          Radial pulses are 2+ on the right side and 2+ on the left side.     Heart sounds: Normal heart sounds. No murmur heard.    No friction rub. No gallop.  Pulmonary:     Effort: Pulmonary effort is normal.     Breath sounds: Normal breath sounds. No decreased air movement. No decreased breath sounds, wheezing, rhonchi or rales.  Musculoskeletal:     Cervical back: Normal range of motion and neck supple.     Right lower leg: No edema.     Left lower leg: No edema.  Lymphadenopathy:     Head:     Right side of head: No submental, submandibular or preauricular adenopathy.     Left side of head: No submental,  submandibular or preauricular adenopathy.     Cervical:     Right cervical: No superficial or posterior cervical adenopathy.    Left cervical: No superficial or posterior cervical adenopathy.     Upper Body:     Right upper body: No supraclavicular adenopathy.     Left upper body: No supraclavicular adenopathy.  Neurological:     Mental Status: She is alert.  Psychiatric:        Behavior: Behavior is cooperative.       No results found for any visits on 08/20/22.  Assessment & Plan      No follow-ups on file.      Problem List Items Addressed This Visit   None Visit Diagnoses     Acute sinusitis, recurrence not specified, unspecified location    -  Primary Visit with patient indicates symptoms comprised of productive coughing, nasal congestion, fatigue, diarrhea for the past 2 weeks that is not improving with home measures   Patient is outside therapeutic window for antivirals- no flu or COVID testing performed today.  Will send in script for Augmentin po BID x 10 days and Tessalon pearls for coughing  Discussed with patient the various viral and bacterial etiologies of current illness and appropriate course of treatment Discussed OTC medication options for multisymptom relief such as Dayquil/Nyquil, Theraflu, AlkaSeltzer, etc. Discussed return precautions if symptoms are not improving or worsen over next 5-7 days.      Relevant Medications   amoxicillin-clavulanate (AUGMENTIN) 875-125 MG tablet   benzonatate (TESSALON) 100 MG capsule        No follow-ups on file.   I, Brode Sculley E Jelena Malicoat, PA-C, have reviewed all documentation for this visit. The documentation on 08/20/22 for the exam, diagnosis, procedures, and orders are all accurate and complete.   Jacquelin Hawking, MHS, PA-C [facility]

## 2022-09-18 ENCOUNTER — Ambulatory Visit (INDEPENDENT_AMBULATORY_CARE_PROVIDER_SITE_OTHER): Payer: Medicare Other

## 2022-09-18 VITALS — Ht 63.5 in | Wt 151.0 lb

## 2022-09-18 DIAGNOSIS — Z0000 Encounter for general adult medical examination without abnormal findings: Secondary | ICD-10-CM

## 2022-09-18 NOTE — Progress Notes (Signed)
Subjective:   Rhonda Lamb is a 77 y.o. female who presents for Medicare Annual (Subsequent) preventive examination.  Visit Complete: Virtual  I connected with  Rhonda Lamb on 09/18/22 by a audio enabled telemedicine application and verified that I am speaking with the correct person using two identifiers.  Patient Location: Home  Provider Location: Office/Clinic  I discussed the limitations of evaluation and management by telemedicine. The patient expressed understanding and agreed to proceed.  Per patient no change in vitals since last visit, unable to obtain new vitals due to telehealth visit  Review of Systems     Cardiac Risk Factors include: advanced age (>10men, >9 women);dyslipidemia;hypertension     Objective:    Today's Vitals   09/18/22 1507  Weight: 151 lb (68.5 kg)  Height: 5' 3.5" (1.613 m)   Body mass index is 26.33 kg/m.     09/18/2022    3:13 PM 06/06/2020    8:30 AM  Advanced Directives  Does Patient Have a Medical Advance Directive? Yes Yes  Type of Estate agent of [place];Living will Healthcare Power of [place];Living will  Does patient want to make changes to medical advance directive?  No - Patient declined  Copy of Healthcare Power of Attorney in Chart? No - copy requested No - copy requested    Current Medications (verified) Outpatient Encounter Medications as of 09/18/2022  Medication Sig   atorvastatin (LIPITOR) 10 MG tablet TAKE 1 TABLET EVERY DAY   amoxicillin-clavulanate (AUGMENTIN) 875-125 MG tablet Take 1 tablet by mouth 2 (two) times daily. (Patient not taking: Reported on 09/18/2022)   benzonatate (TESSALON) 100 MG capsule Take 1 capsule (100 mg total) by mouth 2 (two) times daily as needed for cough. (Patient not taking: Reported on 09/18/2022)   busPIRone (BUSPAR) 5 MG tablet TAKE 1 TABLET EVERY DAY AS NEEDED (Patient not taking: Reported on 08/20/2022)   No facility-administered encounter medications  on file as of 09/18/2022.    Allergies (verified) Patient has no known allergies.   History: Past Medical History:  Diagnosis Date   Motion sickness    circular motion   Vertigo    none in over 1 year   Past Surgical History:  Procedure Laterality Date   COLONOSCOPY WITH PROPOFOL N/A 06/06/2020   Procedure: COLONOSCOPY WITH PROPOFOL;  Surgeon: Midge Minium, MD;  Location: [facility];  Service: Endoscopy;  Laterality: N/A;  priority 4   WISDOM TOOTH EXTRACTION     Family History  Problem Relation Age of Onset   Breast cancer Neg Hx    Social History   Socioeconomic History   Marital status: Married    Spouse name: Not on file   Number of children: 3   Years of education: Not on file   Highest education level: Not on file  Occupational History   Occupation: Accounting  Tobacco Use   Smoking status: Never   Smokeless tobacco: Never  Vaping Use   Vaping status: Never Used  Substance and Sexual Activity   Alcohol use: Never   Drug use: Never   Sexual activity: Not on file  Other Topics Concern   Not on file  Social History Narrative   Not on file   Social Determinants of Health   Financial Resource Strain: Low Risk  (09/18/2022)   Overall Financial Resource Strain (CARDIA)    Difficulty of Paying Living Expenses: Not hard at all  Food Insecurity: No Food Insecurity (09/18/2022)   Hunger Vital Sign  Worried About Programme researcher, broadcasting/film/video in the Last Year: Never true    Ran Out of Food in the Last Year: Never true  Transportation Needs: No Transportation Needs (09/18/2022)   PRAPARE - Administrator, Civil Service (Medical): No    Lack of Transportation (Non-Medical): No  Physical Activity: Inactive (09/18/2022)   Exercise Vital Sign    Days of Exercise per Week: 0 days    Minutes of Exercise per Session: 0 min  Stress: No Stress Concern Present (09/18/2022)   [employer] of Occupational Health - Occupational Stress Questionnaire    Feeling  of Stress : Not at all  Social Connections: Moderately Integrated (09/18/2022)   Social Connection and Isolation Panel [NHANES]    Frequency of Communication with Friends and Family: More than three times a week    Frequency of Social Gatherings with Friends and Family: Once a week    Attends Religious Services: More than 4 times per year    Active Member of [employer] or Organizations: No    Attends Engineer, structural: Never    Marital Status: Married    Tobacco Counseling Counseling given: Not Answered   Clinical Intake:  Pre-visit preparation completed: Yes  Pain : No/denies pain     Nutritional Status: BMI 25 -29 Overweight Nutritional Risks: None Diabetes: No  How often do you need to have someone help you when you read instructions, pamphlets, or other written materials from your doctor or pharmacy?: 1 - Never  Interpreter Needed?: No  Information entered by :: NAllen LPN   Activities of Daily Living    09/18/2022    3:08 PM 08/06/2022    8:46 AM  In your present state of health, do you have any difficulty performing the following activities:  Hearing? 0 0  Vision? 0 0  Difficulty concentrating or making decisions? 0 0  Walking or climbing stairs? 0 0  Dressing or bathing? 0 0  Doing errands, shopping? 0 0  Preparing Food and eating ? N   Using the Toilet? N   In the past six months, have you accidently leaked urine? N   Do you have problems with loss of bowel control? N   Managing your Medications? N   Managing your Finances? N   Housekeeping or managing your Housekeeping? N     Patient Care Team: Lorre Munroe, NP as PCP - General (Internal Medicine)  Indicate any recent Medical Services you may have received from other than [facility] providers in the past year (date may be approximate).     Assessment:   This is a routine wellness examination for Rhonda Lamb.  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 0 0 0 0 0  Number falls in past yr: 0   0 0  Injury with Fall? 0 0 0 0 0  Risk for fall due to : Medication side effect No Fall Risks  No Fall Risks No Fall Risks  Follow up Falls prevention discussed;Falls evaluation completed   Falls evaluation completed Falls evaluation completed    MEDICARE RISK AT HOME:  Medicare Risk at Home - 09/18/22 1514     Any stairs in or around the home? Yes    If so, are there any without handrails? No    Home free of loose throw rugs in walkways, pet beds, electrical cords, etc? Yes    Adequate lighting in your home to reduce risk of falls? Yes    Life alert? No    Use of a cane, walker or w/c? No    Grab bars in the bathroom? Yes    Shower chair or bench in shower? Yes    Elevated toilet seat or a handicapped toilet? Yes             TIMED UP AND GO:  Was the test performed?  No    Cognitive Function:        09/18/2022    3:16 PM 04/15/2021    5:07 PM 04/15/2021    4:00 PM  6CIT Screen  What Year? 0 points 0 points 0 points  What month? 0 points 0 points 0 points  What time? 0 points 0 points 0 points  Count back from 20 0 points 0 points 0 points  Months in reverse 0 points 0 points 0 points  Repeat phrase 0 points 0 points 0 points  Total Score 0 points 0 points 0 points     Immunizations Immunization History  Administered Date(s) Administered   Fluad Quad(high Dose 65+) 11/16/2019, 02/04/2022   H1N1 03/12/2008   Influenza Split 12/04/2015   Influenza, High Dose Seasonal PF 11/10/2014, 01/13/2017, 03/05/2018   Influenza,inj,Quad PF,6+ Mos 02/11/2011   PFIZER(Purple Top)SARS-COV-2 Vaccination 04/24/2019, 05/15/2019   PPD Test 10/20/2021   Pneumococcal Conjugate-13 08/21/2013   Pneumococcal Polysaccharide-23 01/06/2017   Tdap 02/11/2011    TDAP status: Due, Education has been provided regarding the importance of this vaccine. Advised may receive this vaccine at local pharmacy or Health Dept. Aware to provide a copy of the vaccination record if obtained from local pharmacy or Health Dept. Verbalized acceptance and understanding.  Flu Vaccine status: Up to date  Pneumococcal vaccine status: Up to date  Covid-19 vaccine status: Completed vaccines  Qualifies for Shingles Vaccine? Yes   Zostavax completed No   Shingrix Completed?: No.    Education has been provided regarding the importance of this vaccine. Patient has been advised to call insurance company to determine out of pocket expense if they have not yet received this vaccine. Advised may also receive vaccine at local pharmacy or Health Dept. Verbalized acceptance and understanding.  Screening Tests Health Maintenance  Topic Date Due   Zoster Vaccines- Shingrix (1 of 2) Never done   DTaP/Tdap/Td (2 - Td or Tdap) 02/10/2021   INFLUENZA VACCINE  10/01/2022   Medicare Annual Wellness (AWV)  09/18/2023   MAMMOGRAM  02/18/2024   Colonoscopy  06/07/2030  Pneumonia Vaccine 52+ Years old  Completed   DEXA SCAN  Completed   Hepatitis C Screening  Completed   HPV VACCINES  Aged Out   COVID-19 Vaccine  Discontinued    Health Maintenance  Health Maintenance Due  Topic Date Due   Zoster Vaccines- Shingrix (1 of 2) Never done   DTaP/Tdap/Td (2 - Td or Tdap) 02/10/2021    Colorectal cancer  screening: No longer required.   Mammogram status: Completed 02/17/2022. Repeat every year  Bone Density status: Completed 12/07/2019.   Lung Cancer Screening: (Low Dose CT Chest recommended if Age 31-80 years, 20 pack-year currently smoking OR have quit w/in 15years.) does not qualify.   Lung Cancer Screening Referral: no  Additional Screening:  Hepatitis C Screening: does qualify; Completed 04/06/2018  Vision Screening: Recommended annual ophthalmology exams for early detection of glaucoma and other disorders of the eye. Is the patient up to date with their annual eye exam?  Yes  Who is the provider or what is the name of the office in which the patient attends annual eye exams? [facility] If pt is not established with a provider, would they like to be referred to a provider to establish care? No .   Dental Screening: Recommended annual dental exams for proper oral hygiene  Diabetic Foot Exam: n/a  Community Resource Referral / Chronic Care Management: CRR required this visit?  No   CCM required this visit?  No     Plan:     I have personally reviewed and noted the following in the patient's chart:   Medical and social history Use of alcohol, tobacco or illicit drugs  Current medications and supplements including opioid prescriptions. Patient is not currently taking opioid prescriptions. Functional ability and status Nutritional status Physical activity Advanced directives List of other physicians Hospitalizations, surgeries, and ER visits in previous 12 months Vitals Screenings to include cognitive, depression, and falls Referrals and appointments  In addition, I have reviewed and discussed with patient certain preventive protocols, quality metrics, and best practice recommendations. A written personalized care plan for preventive services as well as general preventive health recommendations were provided to patient.     Barb Merino, LPN   1/61/0960    After Visit Summary: (MyChart) Due to this being a telephonic visit, the after visit summary with patients personalized plan was offered to patient via MyChart   Nurse Notes: none

## 2022-09-18 NOTE — Patient Instructions (Signed)
Rhonda Lamb , Thank you for taking time to come for your Medicare Wellness Visit. I appreciate your ongoing commitment to your health goals. Please review the following plan we discussed and let me know if I can assist you in the future.   These are the goals we discussed:  Goals      DIET - EAT MORE FRUITS AND VEGETABLES     Patient Stated     09/18/2022, working on a detox        This is a list of the screening recommended for you and due dates:  Health Maintenance  Topic Date Due   Zoster (Shingles) Vaccine (1 of 2) Never done   DTaP/Tdap/Td vaccine (2 - Td or Tdap) 02/10/2021   Flu Shot  10/01/2022   Medicare Annual Wellness Visit  09/18/2023   Mammogram  02/18/2024   Colon Cancer Screening  06/07/2030   Pneumonia Vaccine  Completed   DEXA scan (bone density measurement)  Completed   Hepatitis C Screening  Completed   HPV Vaccine  Aged Out   COVID-19 Vaccine  Discontinued    Advanced directives: Please bring a copy of your POA (Power of Attorney) and/or Living Will to your next appointment.   Conditions/risks identified: none  Next appointment: Follow up in one year for your annual wellness visit    Preventive Care 65 Years and Older, Female Preventive care refers to lifestyle choices and visits with your health care provider that can promote health and wellness. What does preventive care include? A yearly physical exam. This is also called an annual well check. Dental exams once or twice a year. Routine eye exams. Ask your health care provider how often you should have your eyes checked. Personal lifestyle choices, including: Daily care of your teeth and gums. Regular physical activity. Eating a healthy diet. Avoiding tobacco and drug use. Limiting alcohol use. Practicing safe sex. Taking low-dose aspirin every day. Taking vitamin and mineral supplements as recommended by your health care provider. What happens during an annual well check? The services and  screenings done by your health care provider during your annual well check will depend on your age, overall health, lifestyle risk factors, and family history of disease. Counseling  Your health care provider may ask you questions about your: Alcohol use. Tobacco use. Drug use. Emotional well-being. Home and relationship well-being. Sexual activity. Eating habits. History of falls. Memory and ability to understand (cognition). Work and work Astronomer. Reproductive health. Screening  You may have the following tests or measurements: Height, weight, and BMI. Blood pressure. Lipid and cholesterol levels. These may be checked every 5 years, or more frequently if you are over 34 years old. Skin check. Lung cancer screening. You may have this screening every year starting at age 7 if you have a 30-pack-year history of smoking and currently smoke or have quit within the past 15 years. Fecal occult blood test (FOBT) of the stool. You may have this test every year starting at age 20. Flexible sigmoidoscopy or colonoscopy. You may have a sigmoidoscopy every 5 years or a colonoscopy every 10 years starting at age 31. Hepatitis C blood test. Hepatitis B blood test. Sexually transmitted disease (STD) testing. Diabetes screening. This is done by checking your blood sugar (glucose) after you have not eaten for a while (fasting). You may have this done every 1-3 years. Bone density scan. This is done to screen for osteoporosis. You may have this done starting at age 43. Mammogram. This may  be done every 1-2 years. Talk to your health care provider about how often you should have regular mammograms. Talk with your health care provider about your test results, treatment options, and if necessary, the need for more tests. Vaccines  Your health care provider may recommend certain vaccines, such as: Influenza vaccine. This is recommended every year. Tetanus, diphtheria, and acellular pertussis (Tdap,  Td) vaccine. You may need a Td booster every 10 years. Zoster vaccine. You may need this after age 58. Pneumococcal 13-valent conjugate (PCV13) vaccine. One dose is recommended after age 23. Pneumococcal polysaccharide (PPSV23) vaccine. One dose is recommended after age 82. Talk to your health care provider about which screenings and vaccines you need and how often you need them. This information is not intended to replace advice given to you by your health care provider. Make sure you discuss any questions you have with your health care provider. Document Released: 03/15/2015 Document Revised: 11/06/2015 Document Reviewed: 12/18/2014 Elsevier Interactive Patient Education  2017 [employer].  Fall Prevention in the Home Falls can cause injuries. They can happen to people of all ages. There are many things you can do to make your home safe and to help prevent falls. What can I do on the outside of my home? Regularly fix the edges of walkways and driveways and fix any cracks. Remove anything that might make you trip as you walk through a door, such as a raised step or threshold. Trim any bushes or trees on the path to your home. Use bright outdoor lighting. Clear any walking paths of anything that might make someone trip, such as rocks or tools. Regularly check to see if handrails are loose or broken. Make sure that both sides of any steps have handrails. Any raised decks and porches should have guardrails on the edges. Have any leaves, snow, or ice cleared regularly. Use sand or salt on walking paths during winter. Clean up any spills in your garage right away. This includes oil or grease spills. What can I do in the bathroom? Use night lights. Install grab bars by the toilet and in the tub and shower. Do not use towel bars as grab bars. Use non-skid mats or decals in the tub or shower. If you need to sit down in the shower, use a plastic, non-slip stool. Keep the floor dry. Clean up any  water that spills on the floor as soon as it happens. Remove soap buildup in the tub or shower regularly. Attach bath mats securely with double-sided non-slip rug tape. Do not have throw rugs and other things on the floor that can make you trip. What can I do in the bedroom? Use night lights. Make sure that you have a light by your bed that is easy to reach. Do not use any sheets or blankets that are too big for your bed. They should not hang down onto the floor. Have a firm chair that has side arms. You can use this for support while you get dressed. Do not have throw rugs and other things on the floor that can make you trip. What can I do in the kitchen? Clean up any spills right away. Avoid walking on wet floors. Keep items that you use a lot in easy-to-reach places. If you need to reach something above you, use a strong step stool that has a grab bar. Keep electrical cords out of the way. Do not use floor polish or wax that makes floors slippery. If you must use  wax, use non-skid floor wax. Do not have throw rugs and other things on the floor that can make you trip. What can I do with my stairs? Do not leave any items on the stairs. Make sure that there are handrails on both sides of the stairs and use them. Fix handrails that are broken or loose. Make sure that handrails are as long as the stairways. Check any carpeting to make sure that it is firmly attached to the stairs. Fix any carpet that is loose or worn. Avoid having throw rugs at the top or bottom of the stairs. If you do have throw rugs, attach them to the floor with carpet tape. Make sure that you have a light switch at the top of the stairs and the bottom of the stairs. If you do not have them, ask someone to add them for you. What else can I do to help prevent falls? Wear shoes that: Do not have high heels. Have rubber bottoms. Are comfortable and fit you well. Are closed at the toe. Do not wear sandals. If you use a  stepladder: Make sure that it is fully opened. Do not climb a closed stepladder. Make sure that both sides of the stepladder are locked into place. Ask someone to hold it for you, if possible. Clearly mark and make sure that you can see: Any grab bars or handrails. First and last steps. Where the edge of each step is. Use tools that help you move around (mobility aids) if they are needed. These include: Canes. Walkers. Scooters. Crutches. Turn on the lights when you go into a dark area. Replace any light bulbs as soon as they burn out. Set up your furniture so you have a clear path. Avoid moving your furniture around. If any of your floors are uneven, fix them. If there are any pets around you, be aware of where they are. Review your medicines with your doctor. Some medicines can make you feel dizzy. This can increase your chance of falling. Ask your doctor what other things that you can do to help prevent falls. This information is not intended to replace advice given to you by your health care provider. Make sure you discuss any questions you have with your health care provider. Document Released: 12/13/2008 Document Revised: 07/25/2015 Document Reviewed: 03/23/2014 Elsevier Interactive Patient Education  2017 [employer].

## 2022-09-23 ENCOUNTER — Ambulatory Visit: Payer: Medicare Other | Admitting: Internal Medicine

## 2022-10-12 ENCOUNTER — Ambulatory Visit: Payer: Self-pay | Admitting: *Deleted

## 2022-10-12 NOTE — Telephone Encounter (Signed)
Summary: cough   Pt called to make an appt for a dry cough.  It has been on going but there is no appts until a week and half.  It is not productive.  Please advise  [phone]           Chief Complaint: dry cough  Symptoms: dry cough comes and goes. Post nasal drip "down back of throat" at times. On going despite OTC antihistamines , 24 allergy relief from [facility]. 10 mg tablets daily at hs . Has tried hard candy cough drops etc. With no relief. Frequency: 3-4 months  Pertinent Negatives: Patient denies chest pain no difficulty breathing no fever Disposition: [] ED /[] Urgent Care (no appt availability in office) / [x] Appointment(In office/virtual)/ []  [place] [facility]/ [] Home Care/ [] Refused Recommended Disposition /[] [place] [facility]/ []  Follow-up with PCP Additional Notes:   Appt scheduled with PCP 10/23/22. Patient requesting if any earlier appt available. Please advise      Reason for Disposition  Cough  Answer Assessment - Initial Assessment Questions 1. ONSET: "When did the cough begin?"      3-4 months ago  2. SEVERITY: "How bad is the cough today?"      Comes and goes  3. SPUTUM: "Describe the color of your sputum" (none, dry cough; clear, white, yellow, green)     Non productive  4. HEMOPTYSIS: "Are you coughing up any blood?" If so ask: "How much?" (flecks, streaks, tablespoons, etc.)     na 5. DIFFICULTY BREATHING: "Are you having difficulty breathing?" If Yes, ask: "How bad is it?" (e.g., mild, moderate, severe)    - MILD: No SOB at rest, mild SOB with walking, speaks normally in sentences, can lie down, no retractions, pulse < 100.    - MODERATE: SOB at rest, SOB with minimal exertion and prefers to sit, cannot lie down flat, speaks in phrases, mild retractions, audible wheezing, pulse 100-120.    - SEVERE: Very SOB at rest, speaks in single words, struggling to breathe, sitting hunched forward, retractions, pulse > 120      No not out of the  norm. 6. FEVER: "Do you have a fever?" If Yes, ask: "What is your temperature, how was it measured, and when did it start?"     na 7. CARDIAC HISTORY: "Do you have any history of heart disease?" (e.g., heart attack, congestive heart failure)      Na  8. LUNG HISTORY: "Do you have any history of lung disease?"  (e.g., pulmonary embolus, asthma, emphysema)     Na  9. PE RISK FACTORS: "Do you have a history of blood clots?" (or: recent major surgery, recent prolonged travel, bedridden)     na 10. OTHER SYMPTOMS: "Do you have any other symptoms?" (e.g., runny nose, wheezing, chest pain)       Post nasal drip dry cough  11. PREGNANCY: "Is there any chance you are pregnant?" "When was your last menstrual period?"       na 12. TRAVEL: "Have you traveled out of the country in the last month?" (e.g., travel history, exposures)       na  Protocols used: Cough - Acute Non-Productive-A-AH

## 2022-10-13 NOTE — Telephone Encounter (Signed)
Left message advising pt that Rene Kocher does not have anything sooner.  She may schedule with Denny Peon if she is needed an appointment sooner.    Thanks,   -Vernona Rieger

## 2022-10-23 ENCOUNTER — Ambulatory Visit: Payer: Medicare Other | Admitting: Internal Medicine

## 2022-10-23 ENCOUNTER — Other Ambulatory Visit: Payer: Self-pay | Admitting: Internal Medicine

## 2022-10-23 DIAGNOSIS — E782 Mixed hyperlipidemia: Secondary | ICD-10-CM

## 2022-10-23 NOTE — Telephone Encounter (Signed)
Requested by interface surescripts. Future visit in 3 months. Last labs 08/06/22. Requested Prescriptions  Pending Prescriptions Disp Refills   atorvastatin (LIPITOR) 10 MG tablet [Pharmacy Med Name: ATORVASTATIN CALCIUM 10 MG Tablet] 90 tablet 1    Sig: TAKE 1 TABLET EVERY DAY     Cardiovascular:  Antilipid - Statins Failed - 10/23/2022  2:32 AM      Failed - Lipid Panel in normal range within the last 12 months    Cholesterol  Date Value Ref Range Status  08/06/2022 138 <200 mg/dL Final   LDL Cholesterol (Calc)  Date Value Ref Range Status  08/06/2022 52 mg/dL (calc) Final    Comment:    Reference range: <100 . Desirable range <100 mg/dL for primary prevention;   <70 mg/dL for patients with CHD or diabetic patients  with > or = 2 CHD risk factors. Marland Kitchen LDL-C is now calculated using the Martin-Hopkins  calculation, which is a validated novel method providing  better accuracy than the Friedewald equation in the  estimation of LDL-C.  Horald Pollen et al. Lenox Ahr. 7564;332(95): 2061-2068  (http://education.[facility].com/faq/FAQ164)    HDL  Date Value Ref Range Status  08/06/2022 65 > OR = 50 mg/dL Final   Triglycerides  Date Value Ref Range Status  08/06/2022 119 <150 mg/dL Final         Passed - Patient is not pregnant      Passed - Valid encounter within last 12 months    Recent Outpatient Visits           2 months ago Acute sinusitis, recurrence not specified, unspecified location   [place] [place], Oswaldo Conroy, PA-C   2 months ago Essential hypertension   [facility] [place], Salvadore Oxford, NP   8 months ago Need for influenza vaccination   [facility] [place], Salvadore Oxford, NP   1 year ago Viral bronchitis   [facility] [place], Salvadore Oxford, NP   1 year ago Viral sinusitis   [facility] [place], Salvadore Oxford, NP       Future Appointments              In 3 months Baity, Salvadore Oxford, NP [facility], [facility]

## 2022-11-20 ENCOUNTER — Ambulatory Visit (INDEPENDENT_AMBULATORY_CARE_PROVIDER_SITE_OTHER): Payer: Medicare Other | Admitting: Internal Medicine

## 2022-11-20 ENCOUNTER — Ambulatory Visit
Admission: RE | Admit: 2022-11-20 | Discharge: 2022-11-20 | Disposition: A | Discharge status: Home / Self Care | Payer: Medicare Other | Attending: Internal Medicine | Admitting: Internal Medicine

## 2022-11-20 ENCOUNTER — Encounter: Payer: Self-pay | Admitting: Internal Medicine

## 2022-11-20 ENCOUNTER — Ambulatory Visit
Admission: RE | Admit: 2022-11-20 | Discharge: 2022-11-20 | Disposition: A | Discharge status: Home / Self Care | Payer: Medicare Other | Source: Ambulatory Visit | Attending: Internal Medicine | Admitting: Internal Medicine

## 2022-11-20 VITALS — BP 138/84 | HR 75 | Temp 95.9°F | Wt 153.0 lb

## 2022-11-20 DIAGNOSIS — M542 Cervicalgia: Secondary | ICD-10-CM | POA: Insufficient documentation

## 2022-11-20 DIAGNOSIS — R053 Chronic cough: Secondary | ICD-10-CM | POA: Insufficient documentation

## 2022-11-20 DIAGNOSIS — R002 Palpitations: Secondary | ICD-10-CM

## 2022-11-20 DIAGNOSIS — R7303 Prediabetes: Secondary | ICD-10-CM | POA: Insufficient documentation

## 2022-11-20 DIAGNOSIS — Z23 Encounter for immunization: Secondary | ICD-10-CM

## 2022-11-20 DIAGNOSIS — E538 Deficiency of other specified B group vitamins: Secondary | ICD-10-CM

## 2022-11-20 DIAGNOSIS — R42 Dizziness and giddiness: Secondary | ICD-10-CM

## 2022-11-20 DIAGNOSIS — R202 Paresthesia of skin: Secondary | ICD-10-CM | POA: Diagnosis not present

## 2022-11-20 DIAGNOSIS — E559 Vitamin D deficiency, unspecified: Secondary | ICD-10-CM

## 2022-11-20 NOTE — Patient Instructions (Signed)
Palpitations Palpitations are feelings that your heartbeat is not normal. Your heartbeat may feel like it is: Uneven (irregular). Faster than normal. Fluttering. Skipping a beat. This is usually not a serious problem. However, a doctor will do tests and check your medical history to make sure that you do not have a serious heart problem. Follow these instructions at home: Watch for any changes in your condition. Tell your doctor about any changes. Take these actions to help manage your symptoms: Eating and drinking Follow instructions from your doctor about things to eat and drink. You may be told to avoid these things: Drinks that have caffeine in them, such as coffee, tea, soft drinks, and energy drinks. Chocolate. Alcohol. Diet pills. Lifestyle     Try to lower your stress. These things can help you relax: Yoga. Deep breathing and meditation. Guided imagery. This is using words and images to create positive thoughts. Exercise, including swimming, jogging, and walking. Tell your doctor if you have more abnormal heartbeats when you are active. If you have chest pain or feel short of breath with exercise, do not keep doing the exercise until you are seen by your doctor. Biofeedback. This is using your mind to control things in your body, such as your heartbeat. Get plenty of rest and sleep. Keep a regular bed time. Do not use drugs, such as cocaine or ecstasy. Do not use marijuana. Do not smoke or use any products that contain nicotine or tobacco. If you need help quitting, ask your doctor. General instructions Take over-the-counter and prescription medicines only as told by your doctor. Keep all follow-up visits. You may need more tests if palpitations do not go away or get worse. Contact a doctor if: You keep having fast or uneven heartbeats for a long time. Your symptoms happen more often. Get help right away if: You have chest pain. You feel short of breath. You have a very  bad headache. You feel dizzy. You faint. These symptoms may be an emergency. Get help right away. Call your local emergency services (911 in the [place] Do not wait to see if the symptoms will go away. Do not drive yourself to the hospital. Summary Palpitations are feelings that your heartbeat is uneven or faster than normal. It may feel like your heart is fluttering or skipping a beat. Avoid food and drinks that may cause this condition. These include caffeine, chocolate, and alcohol. Try to lower your stress. Do not smoke or use drugs. Get help right away if you faint, feel dizzy, feel short of breath, have chest pain, or have a very bad headache. This information is not intended to replace advice given to you by your health care provider. Make sure you discuss any questions you have with your health care provider. Document Revised: 07/10/2020 Document Reviewed: 07/10/2020 Elsevier Patient Education  2024 [employer].

## 2022-11-20 NOTE — Progress Notes (Signed)
Subjective:    Patient ID: Rhonda Lamb, female    DOB: [birth year], 77 y.o.   MRN: [id number]  HPI  Discussed the use of AI scribe software for clinical note transcription with the patient, who gave verbal consent to proceed.  History of Present Illness   The patient presents with a chronic dry cough that has been ongoing for several years but has worsened in the last couple of months. The cough is non-productive and is not associated with any other respiratory symptoms. They have a history of dust allergies and have been taking Zyrtec without relief. She reports intermittent reflux in the past and has taken OTC antacids but not on a routine basis. She denies hoarseness or sore throat. Although her blood pressure is elevated today at 144/96, she has no history of hypertension and is not currently taking an ACEI's or ARB's. She has no history of asthma. She does not smoke.  In addition to the cough, the patient has been experiencing intermittent sharp pain in the right leg, right arm, and right side of the neck. The pain is described as similar to a toothache and is associated with a strange tingling sensation in the arm, but not with any specific activities or movements.  She denies numbness or weakness of the right upper extremity.  There is no imaging of the cervical spine on file.  The patient also reports an episode of heart palpitations and lightheadedness while driving, which was associated with a feeling of weakness. This episode was not associated with the right-sided pain neck, arm or leg pain. She denies near syncope, chest pain, shortness of breath or excessive sweating. The patient has been under some stress due to concerns about a family member's health and wellbeing. She has a history of anxiety but is not taking buspirone as prescribed.       Review of Systems     Past Medical History:  Diagnosis Date   Motion sickness    circular motion   Vertigo    none in over 1  year    Current Outpatient Medications  Medication Sig Dispense Refill   amoxicillin-clavulanate (AUGMENTIN) 875-125 MG tablet Take 1 tablet by mouth 2 (two) times daily. (Patient not taking: Reported on 09/18/2022) 20 tablet 0   atorvastatin (LIPITOR) 10 MG tablet TAKE 1 TABLET EVERY DAY 90 tablet 1   benzonatate (TESSALON) 100 MG capsule Take 1 capsule (100 mg total) by mouth 2 (two) times daily as needed for cough. (Patient not taking: Reported on 09/18/2022) 20 capsule 0   busPIRone (BUSPAR) 5 MG tablet TAKE 1 TABLET EVERY DAY AS NEEDED (Patient not taking: Reported on 08/20/2022) 90 tablet 0   No current facility-administered medications for this visit.    No Known Allergies  Family History  Problem Relation Age of Onset   Breast cancer Neg Hx     Social History   Socioeconomic History   Marital status: Married    Spouse name: Not on file   Number of children: 3   Years of education: Not on file   Highest education level: Not on file  Occupational History   Occupation: Accounting  Tobacco Use   Smoking status: Never   Smokeless tobacco: Never  Vaping Use   Vaping status: Never Used  Substance and Sexual Activity   Alcohol use: Never   Drug use: Never   Sexual activity: Not on file  Other Topics Concern   Not on file  Social History Narrative  Not on file   Social Determinants of Health   Financial Resource Strain: Low Risk  (09/18/2022)   Overall Financial Resource Strain (CARDIA)    Difficulty of Paying Living Expenses: Not hard at all  Food Insecurity: No Food Insecurity (09/18/2022)   Hunger Vital Sign    Worried About Running Out of Food in the Last Year: Never true    Ran Out of Food in the Last Year: Never true  Transportation Needs: No Transportation Needs (09/18/2022)   PRAPARE - Administrator, Civil Service (Medical): No    Lack of Transportation (Non-Medical): No  Physical Activity: Inactive (09/18/2022)   Exercise Vital Sign    Days of  Exercise per Week: 0 days    Minutes of Exercise per Session: 0 min  Stress: No Stress Concern Present (09/18/2022)   [employer] of Occupational Health - Occupational Stress Questionnaire    Feeling of Stress : Not at all  Social Connections: Moderately Integrated (09/18/2022)   Social Connection and Isolation Panel [NHANES]    Frequency of Communication with Friends and Family: More than three times a week    Frequency of Social Gatherings with Friends and Family: Once a week    Attends Religious Services: More than 4 times per year    Active Member of [employer] or Organizations: No    Attends Banker Meetings: Never    Marital Status: Married  Catering manager Violence: Not At Risk (09/18/2022)   Humiliation, Afraid, Rape, and Kick questionnaire    Fear of Current or Ex-Partner: No    Emotionally Abused: No    Physically Abused: No    Sexually Abused: No     Constitutional: Denies fever, malaise, fatigue, headache or abrupt weight changes.  HEENT: Denies eye pain, eye redness, ear pain, ringing in the ears, wax buildup, runny nose, nasal congestion, bloody nose, or sore throat. Respiratory: Denies difficulty breathing, shortness of breath, cough or sputum production.   Cardiovascular: Patient reports palpitations.  Denies chest pain, chest tightness, or swelling in the hands or feet.  Gastrointestinal: Denies abdominal pain, bloating, constipation, diarrhea or blood in the stool.  GU: Denies urgency, frequency, pain with urination, burning sensation, blood in urine, odor or discharge. Musculoskeletal: Patient reports right side neck, right arm and right lower extremity pain.  Denies decrease in range of motion, difficulty with gait, muscle pain or joint swelling.  Skin: Denies redness, rashes, lesions or ulcercations.  Neurological: Patient reports intermittent lightheadedness, paresthesia of right upper extremity.  Denies difficulty with memory, difficulty with speech or  problems with balance and coordination.  Psych: Patient has a history of anxiety.  Denies depression, SI/HI.  No other specific complaints in a complete review of systems (except as listed in HPI above).  Objective:   Physical Exam  BP 138/84   Pulse 75   Temp (!) 95.9 F (35.5 C) (Temporal)   Wt 153 lb (69.4 kg)   SpO2 99%   BMI 26.68 kg/m   Wt Readings from Last 3 Encounters:  09/18/22 151 lb (68.5 kg)  08/20/22 155 lb 12.8 oz (70.7 kg)  08/06/22 158 lb (71.7 kg)    General: Appears her stated age, I will write, in NAD. Skin: Warm, dry and intact.  HEENT: Head: normal shape and size; Eyes: sclera white, no icterus, conjunctiva pink, PERRLA and EOMs intact;  Cardiovascular: Normal rate and rhythm. S1,S2 noted.  No murmur, rubs or gallops noted. No JVD or BLE edema. No carotid  bruits noted. Pulmonary/Chest: Normal effort and positive vesicular breath sounds. No respiratory distress. No wheezes, rales or ronchi noted.  Musculoskeletal: Normal flexion, extension, rotation of the cervical spine.  No bony tenderness noted of the cervical spine.  Pain with palpation of the right paracervical muscles.  Normal internal and external rotation of the right shoulder.  No pain with palpation of the right shoulder.  Strength 5/5 BUE.  Handgrips equal.  Slight pain with palpation over the distal right lower leg, below the knee.  No joint swelling noted.  No difficulty with gait.  Neurological: Alert and oriented. Coordination normal.  Psychiatric: Mood and affect normal. Behavior is normal. Judgment and thought content normal.     BMET    Component Value Date/Time   NA 139 08/06/2022 0854   K 4.4 08/06/2022 0854   CL 106 08/06/2022 0854   CO2 27 08/06/2022 0854   GLUCOSE 91 08/06/2022 0854   BUN 14 08/06/2022 0854   CREATININE 0.85 08/06/2022 0854   CALCIUM 9.0 08/06/2022 0854   GFRNONAA 72 11/16/2019 1058   GFRAA 83 11/16/2019 1058    Lipid Panel     Component Value Date/Time    CHOL 138 08/06/2022 0854   TRIG 119 08/06/2022 0854   HDL 65 08/06/2022 0854   CHOLHDL 2.1 08/06/2022 0854   LDLCALC 52 08/06/2022 0854    CBC    Component Value Date/Time   WBC 4.8 08/06/2022 0854   RBC 4.69 08/06/2022 0854   HGB 14.3 08/06/2022 0854   HCT 43.0 08/06/2022 0854   PLT 209 08/06/2022 0854   MCV 91.7 08/06/2022 0854   MCH 30.5 08/06/2022 0854   MCHC 33.3 08/06/2022 0854   RDW 12.3 08/06/2022 0854   LYMPHSABS 1,946 05/21/2020 0913   EOSABS 108 05/21/2020 0913   BASOSABS 38 05/21/2020 0913    Hgb A1C Lab Results  Component Value Date   HGBA1C 5.7 (H) 08/06/2022           Assessment & Plan:  Assessment and Plan    Chronic Cough Dry, non-productive cough ongoing for several years, exacerbated in the past few months. No relief with Zyrtec. No current ACE inhibitor use. No symptoms of reflux or heartburn. No history of smoking or asthma. -Order chest x-ray to evaluate lung health. -If chest x-ray is clear, initiate trial of Omeprazole OTC daily for two weeks in case this is silent reflux.  Right-sided Neck, Arm, and Leg Pain Intermittent sharp pain in right leg, arm, and neck. Associated with tingling sensation in right arm. No concurrent symptoms with recent palpitations and lightheadedness. -Order cervical spine x-ray to evaluate for potential nerve impingement. -Neck exercise given -Will obtain Vit D, B12 and TSH for further evaluation of symptoms  Palpitations and Lightheadedness Recent episode of palpitations and lightheadedness while driving. Possible stress-related. -Indication for ECG: palpitations -Interpretation of ECG: normal rate and rhythm, no acute findings -Comparison of ECG: 10/2020, unchanged -Order CBC, metabolic panel, thyroid function, to rule out electrolyte imbalance or thyroid abnormality.  Follow-up Await results of labs and x-rays. Follow-up with patient once results are available to discuss findings and formulate definitive  plan.         RTC in 3 months for follow-up of chronic conditions [name], NP

## 2022-11-21 LAB — BASIC METABOLIC PANEL WITH GFR
BUN: 11 mg/dL (ref 7–25)
CO2: 26 mmol/L (ref 20–32)
Calcium: 8.9 mg/dL (ref 8.6–10.4)
Chloride: 104 mmol/L (ref 98–110)
Creat: 0.81 mg/dL (ref 0.60–1.00)
Glucose, Bld: 99 mg/dL (ref 65–99)
Potassium: 3.9 mmol/L (ref 3.5–5.3)
Sodium: 138 mmol/L (ref 135–146)
eGFR: 76 mL/min/{1.73_m2} (ref >=60)

## 2022-11-21 LAB — CBC
HCT: 44.3 % (ref 35.0–45.0)
Hemoglobin: 14.7 g/dL (ref 11.7–15.5)
MCH: 30.9 pg (ref 27.0–33.0)
MCHC: 33.2 g/dL (ref 32.0–36.0)
MCV: 93.1 fL (ref 80.0–100.0)
MPV: 11.3 fL (ref 7.5–12.5)
Platelets: 177 10*3/uL (ref 140–400)
RBC: 4.76 10*6/uL (ref 3.80–5.10)
RDW: 12.4 % (ref 11.0–15.0)
WBC: 4.8 10*3/uL (ref 3.8–10.8)

## 2022-11-21 LAB — TSH: TSH: 1.33 mIU/L (ref 0.40–4.50)

## 2022-11-21 LAB — VITAMIN B12: Vitamin B-12: 324 pg/mL (ref 200–1100)

## 2022-11-21 LAB — VITAMIN D 25 HYDROXY (VIT D DEFICIENCY, FRACTURES): Vit D, 25-Hydroxy: 26 ng/mL — ABNORMAL LOW (ref 30–100)

## 2022-11-23 MED ORDER — VITAMIN D (ERGOCALCIFEROL) 1.25 MG (50000 UNIT) PO CAPS: 50000.0000 [IU] | ORAL_CAPSULE | ORAL | 0 refills | Status: DC

## 2022-11-23 NOTE — Addendum Note (Signed)
Addended by: Lorre Munroe on: 11/23/2022 09:27 AM   Modules accepted: Orders

## 2023-02-08 ENCOUNTER — Ambulatory Visit: Payer: Medicare Other | Admitting: Internal Medicine

## 2023-02-11 ENCOUNTER — Ambulatory Visit: Payer: Self-pay

## 2023-02-11 NOTE — Telephone Encounter (Signed)
  Chief Complaint: ear pain Symptoms: R ear pain, water sensation, dizziness Frequency: yesterday Pertinent Negatives: Patient denies drainage Disposition: [] ED /[x] Urgent Care (no appt availability in office) / [] Appointment(In office/virtual)/ []  [facility]/ [] Home Care/ [] Refused Recommended Disposition /[] [facility]/ []  Follow-up with PCP Additional Notes: pt states she feels like she has inner ear. Had water sensation and dizziness period today and yesterday. Felt like room was spinning. No appts with practice. Pt wanted to be seen today, offered UC tomorrow. Scheduled UC appt tomorrow at 1115. Care advice given and pt verbalized understanding.   Summary: Dizziness and right ear flutter Advice   Pt is calling to report with a water Right ear yesterday and felt it again today with dizziness. No available apts.         Reason for Disposition  Diagnosis is uncertain  Answer Assessment - Initial Assessment Questions 1. LOCATION: "Which ear is involved?"      R ear  2. SYMPTOMS: "What are the main symptoms?" (e.g., pain, redness, itching, discharge)     Felt water sensation in ear  3. MOVEMENT: "Does the pain increase when the ear is moved up and down?" Does pushing on the tab of tissue in the front of the ear increase the pain?"      Movement of water sensation  4. PAIN: "How bad is the pain?"  (Scale 1-10; mild, moderate or severe)   - MILD (1-3): doesn't interfere with normal activities    - MODERATE (4-7): interferes with normal activities or awakens from sleep    - SEVERE (8-10): excruciating pain, unable to do any normal activities      no 5. ONSET: "When did the ear symptoms start?"      Yesterday  6. DISCHARGE: "Is there any discharge? What color is it?"      no 7. SWIMMING: "Have you been swimming recently?" If Yes, ask: "How often do you swim? Is it in a pool, lake or ocean?"      no 8. COTTON EAR SWABS: "Do you use cotton ear swabs (Q-tips)? How  often?" (e.g., never, daily, weekly)     no  Protocols used: Ear - Swimmer's-A-AH

## 2023-02-12 ENCOUNTER — Ambulatory Visit
Admission: RE | Admit: 2023-02-12 | Discharge: 2023-02-12 | Disposition: A | Discharge status: Home / Self Care | Payer: Medicare Other | Source: Ambulatory Visit | Attending: Emergency Medicine | Admitting: Emergency Medicine

## 2023-02-12 VITALS — BP 122/85 | HR 79 | Temp 98.1°F | Resp 18

## 2023-02-12 DIAGNOSIS — H6121 Impacted cerumen, right ear: Secondary | ICD-10-CM | POA: Diagnosis not present

## 2023-02-12 DIAGNOSIS — R42 Dizziness and giddiness: Secondary | ICD-10-CM | POA: Diagnosis not present

## 2023-02-12 DIAGNOSIS — H60501 Unspecified acute noninfective otitis externa, right ear: Secondary | ICD-10-CM | POA: Diagnosis not present

## 2023-02-12 MED ORDER — OFLOXACIN 0.3 % OT SOLN: 10.0000 [drp] | Freq: Every day | OTIC | 0 refills | Status: DC

## 2023-02-12 NOTE — ED Triage Notes (Signed)
Patient to Urgent Care with complaints of right sided ear discomfort. Describes feeling like she has water in her ear/ a "fluttering" sensation. Reports she did have period yesterday where she felt dizzy; reports she felt like the room was spinning. Dizziness resolved after she laid down for 30 minutes.  Symptoms started yesterday. Reports she has been very busy over the last few days.

## 2023-02-12 NOTE — Discharge Instructions (Addendum)
Use the eardrops as directed.  Follow-up with your primary care provider or an ENT.  Go to the emergency department if you have worsening symptoms.

## 2023-02-12 NOTE — ED Provider Notes (Signed)
[name]    CSN: [id number] Arrival date & time: 02/12/23  1112      History   Chief Complaint Chief Complaint  Patient presents with   Ear Fullness    Entered by patient    HPI Rhonda Lamb is a 77 y.o. female.  Patient presents with right ear pain x 1 day.  She had a sensation of fluid in this ear yesterday with associated nausea and dizziness which resolved after lying down.  No dizziness today.  No chest pain, shortness of breath, focal weakness.  Her medical history includes vertigo, hypertension, prediabetes, hyperlipidemia.  The history is provided by the patient and medical records.    Past Medical History:  Diagnosis Date   Motion sickness    circular motion   Vertigo    none in over 1 year    Patient Active Problem List   Diagnosis Date Noted   Prediabetes 11/20/2022   GAD (generalized anxiety disorder) 02/04/2022   Overweight with body mass index (BMI) of 27 to 27.9 in adult 09/24/2020   Essential hypertension 06/18/2020   Osteoporosis 12/07/2019   Mixed hyperlipidemia 11/16/2019    Past Surgical History:  Procedure Laterality Date   COLONOSCOPY WITH PROPOFOL N/A 06/06/2020   Procedure: COLONOSCOPY WITH PROPOFOL;  Surgeon: Midge Minium, MD;  Location[facility];  Service: Endoscopy;  Laterality: N/A;  priority 4   WISDOM TOOTH EXTRACTION      OB History   No obstetric history on file.      Home Medications    Prior to Admission medications   Medication Sig Start Date End Date Taking? Authorizing Provider  ofloxacin (FLOXIN) 0.3 % OTIC solution Place 10 drops into the right ear daily. 02/12/23  Yes Mickie Bail, NP  atorvastatin (LIPITOR) 10 MG tablet TAKE 1 TABLET EVERY DAY 10/23/22   Lorre Munroe, NP  busPIRone (BUSPAR) 5 MG tablet TAKE 1 TABLET EVERY DAY AS NEEDED 05/06/22   Lorre Munroe, NP  cetirizine (ZYRTEC) 10 MG tablet Take 10 mg by mouth daily.    [provider]  Vitamin D, Ergocalciferol,  (DRISDOL) 1.25 MG (50000 UNIT) CAPS capsule Take 1 capsule (50,000 Units total) by mouth once a week. For 12 weeks. Then start OTC Vitamin D3 2,000 unit daily. 11/23/22   Lorre Munroe, NP    Family History Family History  Problem Relation Age of Onset   Breast cancer Neg Hx     Social History Social History   Tobacco Use   Smoking status: Never   Smokeless tobacco: Never  Vaping Use   Vaping status: Never Used  Substance Use Topics   Alcohol use: Never   Drug use: Never     Allergies   Patient has no known allergies.   Review of Systems Review of Systems  Constitutional:  Negative for chills and fever.  HENT:  Positive for ear pain. Negative for ear discharge and sore throat.   Respiratory:  Negative for cough and shortness of breath.   Cardiovascular:  Negative for chest pain and palpitations.  Neurological:  Positive for dizziness and light-headedness. Negative for syncope, weakness and numbness.     Physical Exam Triage Vital Signs ED Triage Vitals  Encounter Vitals Group     BP 02/12/23 1128 122/85     Systolic BP Percentile --      Diastolic BP Percentile --      Pulse Rate 02/12/23 1121 79     Resp  02/12/23 1121 18     Temp 02/12/23 1121 98.1 F (36.7 C)     Temp src --      SpO2 02/12/23 1121 96 %     Weight --      Height --      Head Circumference --      Peak Flow --      Pain Score 02/12/23 1121 0     Pain Loc --      Pain Education --      Exclude from Growth Chart --    No data found.  Updated Vital Signs BP 122/85   Pulse 79   Temp 98.1 F (36.7 C)   Resp 18   SpO2 96%   Visual Acuity Right Eye Distance:   Left Eye Distance:   Bilateral Distance:    Right Eye Near:   Left Eye Near:    Bilateral Near:     Physical Exam Constitutional:      General: She is not in acute distress. HENT:     Right Ear: There is impacted cerumen.     Left Ear: Tympanic membrane normal.     Ears:     Comments: Right ear canal partially  impacted with cerumen; able to visualize part of the TM which is clear.  Left ear canal has small amount of cerumen but not impacted.    Nose: Nose normal.     Mouth/Throat:     Mouth: Mucous membranes are moist.     Pharynx: Oropharynx is clear.  Cardiovascular:     Rate and Rhythm: Normal rate and regular rhythm.     Heart sounds: Normal heart sounds.  Pulmonary:     Effort: Pulmonary effort is normal. No respiratory distress.     Breath sounds: Normal breath sounds.  Skin:    General: Skin is warm and dry.  Neurological:     General: No focal deficit present.     Mental Status: She is alert and oriented to person, place, and time.     Sensory: No sensory deficit.     Motor: No weakness.     Gait: Gait normal.      UC Treatments / Results  Labs (all labs ordered are listed, but only abnormal results are displayed) Labs Reviewed - No data to display  EKG   Radiology No results found.  Procedures Procedures (including critical care time)  Medications Ordered in UC Medications - No data to display  Initial Impression / Assessment and Plan / UC Course  I have reviewed the triage vital signs and the nursing notes.  Pertinent labs & imaging results that were available during my care of the patient were reviewed by me and considered in my medical decision making (see chart for details).    Impacted cerumen of right ear canal, acute otitis externa of right ear, dizziness.  Afebrile and vital signs are stable.  Unable to remove cerumen from right ear canal with Cerumenex and ear irrigation.  Patient unable to tolerate attempt to manually removed earwax by this NP.  Her ear canal is mildly irritated after attempts to remove cerumen impaction.  Treating with ofloxacin eardrops.  Instructed patient to follow-up with her PCP or an ENT.  She has no dizziness at this time but ED precautions discussed and education provided on dizziness.  She agrees with plan of care.  Final  Clinical Impressions(s) / UC Diagnoses   Final diagnoses:  Impacted cerumen of right ear  Acute otitis externa of right ear, unspecified type  Dizziness     Discharge Instructions      Use the eardrops as directed.  Follow-up with your primary care provider or an ENT.  Go to the emergency department if you have worsening symptoms.     ED Prescriptions     Medication Sig Dispense Auth. Provider   ofloxacin (FLOXIN) 0.3 % OTIC solution Place 10 drops into the right ear daily. 5 mL Mickie Bail, NP      PDMP not reviewed this encounter.   Mickie Bail, NP 02/12/23 [phone]

## 2023-02-12 NOTE — Telephone Encounter (Signed)
Noted  

## 2023-02-13 ENCOUNTER — Other Ambulatory Visit: Payer: Self-pay | Admitting: Internal Medicine

## 2023-02-15 NOTE — Telephone Encounter (Signed)
Requested medication (s) are due for refill today: na  Requested medication (s) are on the active medication list: yes   Last refill:  11/23/22 #12 0 refills  Future visit scheduled: yes in 3 days   Notes to clinic:  no refills remain. Provider review required . Do you want to refill Rx?     Requested Prescriptions  Pending Prescriptions Disp Refills   Vitamin D, Ergocalciferol, (DRISDOL) 1.25 MG (50000 UNIT) CAPS capsule [Pharmacy Med Name: VITAMIN D2 50,000IU (ERGO) CAP RX] 12 capsule 0    Sig: TAKE 1 CAPSULE BY MOUTH 1 TIME A WEEK FOR 12 WEEKS THEN START OTC VITAMIN-D3 2000 UNIT DAILY     Endocrinology:  Vitamins - Vitamin D Supplementation 2 Failed - 02/15/2023  9:19 AM      Failed - Manual Review: Route requests for 50,000 IU strength to the provider      Failed - Vitamin D in normal range and within 360 days    Vit D, 25-Hydroxy  Date Value Ref Range Status  11/20/2022 26 (L) 30 - 100 ng/mL Final    Comment:    Vitamin D Status         25-OH Vitamin D: . Deficiency:                    <20 ng/mL Insufficiency:             20 - 29 ng/mL Optimal:                 > or = 30 ng/mL . For 25-OH Vitamin D testing on patients on  D2-supplementation and patients for whom quantitation  of D2 and D3 fractions is required, the QuestAssureD(TM) 25-OH VIT D, (D2,D3), LC/MS/MS is recommended: order  code 40981 (patients >75yrs). . See Note 1 . Note 1 . For additional information, please refer to  http://education.QuestDiagnostics.com/faq/FAQ199  (This link is being provided for informational/ educational purposes only.)          Passed - Ca in normal range and within 360 days    Calcium  Date Value Ref Range Status  11/20/2022 8.9 8.6 - 10.4 mg/dL Final         Passed - Valid encounter within last 12 months    Recent Outpatient Visits           2 months ago Paresthesia of right upper extremity   [place] [facility] [place], [place] W, NP   5 months ago  Acute sinusitis, recurrence not specified, unspecified location   [place] [facility] [place], Oswaldo Conroy, PA-C   6 months ago Essential hypertension   [place] [facility] [place], Salvadore Oxford, NP   1 year ago Need for influenza vaccination   [place] [facility] [place], Salvadore Oxford, NP   1 year ago Viral bronchitis   [place] [facility] [place], Salvadore Oxford, NP       Future Appointments             In 3 days [place], Salvadore Oxford, NP  [facility], [facility]

## 2023-02-18 ENCOUNTER — Encounter: Payer: Self-pay | Admitting: Internal Medicine

## 2023-02-18 ENCOUNTER — Ambulatory Visit (INDEPENDENT_AMBULATORY_CARE_PROVIDER_SITE_OTHER): Payer: Medicare Other | Admitting: Internal Medicine

## 2023-02-18 VITALS — BP 138/80 | HR 73 | Ht 63.5 in | Wt 154.0 lb

## 2023-02-18 DIAGNOSIS — E782 Mixed hyperlipidemia: Secondary | ICD-10-CM | POA: Diagnosis not present

## 2023-02-18 DIAGNOSIS — I10 Essential (primary) hypertension: Secondary | ICD-10-CM | POA: Diagnosis not present

## 2023-02-18 DIAGNOSIS — Z6826 Body mass index (BMI) 26.0-26.9, adult: Secondary | ICD-10-CM

## 2023-02-18 DIAGNOSIS — H6121 Impacted cerumen, right ear: Secondary | ICD-10-CM | POA: Diagnosis not present

## 2023-02-18 DIAGNOSIS — E663 Overweight: Secondary | ICD-10-CM

## 2023-02-18 DIAGNOSIS — F411 Generalized anxiety disorder: Secondary | ICD-10-CM

## 2023-02-18 DIAGNOSIS — R7303 Prediabetes: Secondary | ICD-10-CM

## 2023-02-18 DIAGNOSIS — M810 Age-related osteoporosis without current pathological fracture: Secondary | ICD-10-CM

## 2023-02-18 DIAGNOSIS — R053 Chronic cough: Secondary | ICD-10-CM

## 2023-02-18 MED ORDER — OMEPRAZOLE 20 MG PO CPDR: 20.0000 mg | DELAYED_RELEASE_CAPSULE | Freq: Every day | ORAL | 1 refills | Status: DC

## 2023-02-18 MED ORDER — CETIRIZINE HCL 10 MG PO TABS: 10.0000 mg | ORAL_TABLET | Freq: Every day | ORAL | 1 refills | Status: DC

## 2023-02-18 NOTE — Assessment & Plan Note (Signed)
Encouraged diet and exercise for weight loss ?

## 2023-02-18 NOTE — Assessment & Plan Note (Signed)
C-Met and lipid profile today Encouraged to consume low-fat diet Continue atorvastatin 

## 2023-02-18 NOTE — Patient Instructions (Signed)
Prediabetes Eating Plan Prediabetes is a condition that causes blood sugar (glucose) levels to be higher than normal. This increases the risk for developing type 2 diabetes (type 2 diabetes mellitus). Working with a health care provider or nutrition specialist (dietitian) to make diet and lifestyle changes can help prevent the onset of diabetes. These changes may help you: Control your blood glucose levels. Improve your cholesterol levels. Manage your blood pressure. What are tips for following this plan? Reading food labels Read food labels to check the amount of fat, salt (sodium), and sugar in prepackaged foods. Avoid foods that have: Saturated fats. Trans fats. Added sugars. Avoid foods that have more than 300 milligrams (mg) of sodium per serving. Limit your sodium intake to less than 2,300 mg each day. Shopping Avoid buying pre-made and processed foods. Avoid buying drinks with added sugar. Cooking Cook with olive oil. Do not use butter, lard, or ghee. Bake, broil, grill, steam, or boil foods. Avoid frying. Meal planning  Work with your dietitian to create an eating plan that is right for you. This may include tracking how many calories you take in each day. Use a food diary, notebook, or mobile application to track what you eat at each meal. Consider following a Mediterranean diet. This includes: Eating several servings of fresh fruits and vegetables each day. Eating fish at least twice a week. Eating one serving each day of whole grains, beans, nuts, and seeds. Using olive oil instead of other fats. Limiting alcohol. Limiting red meat. Using nonfat or low-fat dairy products. Consider following a plant-based diet. This includes dietary choices that focus on eating mostly vegetables and fruit, grains, beans, nuts, and seeds. If you have high blood pressure, you may need to limit your sodium intake or follow a diet such as the DASH (Dietary Approaches to Stop Hypertension) eating  plan. The DASH diet aims to lower high blood pressure. Lifestyle Set weight loss goals with help from your health care team. It is recommended that most people with prediabetes lose 7% of their body weight. Exercise for at least 30 minutes 5 or more days a week. Attend a support group or seek support from a mental health counselor. Take over-the-counter and prescription medicines only as told by your health care provider. What foods are recommended? Fruits Berries. Bananas. Apples. Oranges. Grapes. Papaya. Mango. Pomegranate. Kiwi. Grapefruit. Cherries. Vegetables Lettuce. Spinach. Peas. Beets. Cauliflower. Cabbage. Broccoli. Carrots. Tomatoes. Squash. Eggplant. Herbs. Peppers. Onions. Cucumbers. Brussels sprouts. Grains Whole grains, such as whole-wheat or whole-grain breads, crackers, cereals, and pasta. Unsweetened oatmeal. Bulgur. Barley. Quinoa. Brown rice. Corn or whole-wheat flour tortillas or taco shells. Meats and other proteins Seafood. Poultry without skin. Lean cuts of pork and beef. Tofu. Eggs. Nuts. Beans. Dairy Low-fat or fat-free dairy products, such as yogurt, cottage cheese, and cheese. Beverages Water. Tea. Coffee. Sugar-free or diet soda. Seltzer water. Low-fat or nonfat milk. Milk alternatives, such as soy or almond milk. Fats and oils Olive oil. Canola oil. Sunflower oil. Grapeseed oil. Avocado. Walnuts. Sweets and desserts Sugar-free or low-fat pudding. Sugar-free or low-fat ice cream and other frozen treats. Seasonings and condiments Herbs. Sodium-free spices. Mustard. Relish. Low-salt, low-sugar ketchup. Low-salt, low-sugar barbecue sauce. Low-fat or fat-free mayonnaise. The items listed above may not be a complete list of recommended foods and beverages. Contact a dietitian for more information. What foods are not recommended? Fruits Fruits canned with syrup. Vegetables Canned vegetables. Frozen vegetables with butter or cream sauce. Grains Refined white  flour and flour   products, such as bread, pasta, snack foods, and cereals. Meats and other proteins Fatty cuts of meat. Poultry with skin. Breaded or fried meat. Processed meats. Dairy Full-fat yogurt, cheese, or milk. Beverages Sweetened drinks, such as iced tea and soda. Fats and oils Butter. Lard. Ghee. Sweets and desserts Baked goods, such as cake, cupcakes, pastries, cookies, and cheesecake. Seasonings and condiments Spice mixes with added salt. Ketchup. Barbecue sauce. Mayonnaise. The items listed above may not be a complete list of foods and beverages that are not recommended. Contact a dietitian for more information. Where to find more information American Diabetes Association: www.diabetes.org Summary You may need to make diet and lifestyle changes to help prevent the onset of diabetes. These changes can help you control blood sugar, improve cholesterol levels, and manage blood pressure. Set weight loss goals with help from your health care team. It is recommended that most people with prediabetes lose 7% of their body weight. Consider following a Mediterranean diet. This includes eating plenty of fresh fruits and vegetables, whole grains, beans, nuts, seeds, fish, and low-fat dairy, and using olive oil instead of other fats. This information is not intended to replace advice given to you by your health care provider. Make sure you discuss any questions you have with your health care provider. Document Revised: 05/18/2019 Document Reviewed: 05/18/2019 Elsevier Patient Education  2024 Elsevier Inc.  

## 2023-02-18 NOTE — Assessment & Plan Note (Signed)
Improved with omeprazole and cetirizine OTC

## 2023-02-18 NOTE — Assessment & Plan Note (Signed)
Continue taking calcium and vitamin D OTC Encouraged daily weightbearing exercise

## 2023-02-18 NOTE — Assessment & Plan Note (Signed)
A1c today Encourage low-carb diet and exercise for weight loss

## 2023-02-18 NOTE — Assessment & Plan Note (Signed)
Initially elevated but manual repeat improved We will continue to monitor at this time Reinforced DASH diet and exercise for weight loss

## 2023-02-18 NOTE — Progress Notes (Signed)
Subjective:    Patient ID: Rhonda Lamb, female    DOB: November 05, 1945, 77 y.o.   MRN: [id number]  HPI  Patient presents to clinic today for 40-month follow-up of chronic conditions.  HTN: Her BP today is 128/84.  She is not taking any antihypertensive medications at this time.  ECG from 11/2022 reviewed.  Osteoporosis: She had been on ibandronate in the past but is not currently taking this.  She is not taking any calcium but she is taking RX vitamin d OTC.  She tries to get weightbearing exercise.  Bone density from 12/2019 reviewed.  HLD: Her last LDL was 52, triglycerides 621, 08/2022.  She denies myalgias on atorvastatin.  She tries to consume low-fat diet.  Chronic cough: Silent reflux. She is taking omeprazole and zyrtec OTC. There is no upper GI on file.   Anxiety: Intermittent, managed on buspirone as needed.  She is not currently seeing a therapist.  She denies depression, SI/HI.  OA: Mainly in her neck and lower back. She takes tylenol OTC with some relief of symptoms. She does not follow with orthopedics.  Prediabetes: Her last A1c was 5.7%, 08/2022.  She is not taking any oral diabetic medication at this time.  She does not check her sugars.  She also reports recent urgent care visit on 12/13 with complaint of abnormal sensation in the right ear.  They advised her that she had cerumen impaction which they were unsuccessful at removing.  They did place her on ofloxacin for acute otitis externa of the right ear.  She reports she has not had any improvement in her symptoms.   Review of Systems     Past Medical History:  Diagnosis Date   Motion sickness    circular motion   Vertigo    none in over 1 year    Current Outpatient Medications  Medication Sig Dispense Refill   atorvastatin (LIPITOR) 10 MG tablet TAKE 1 TABLET EVERY DAY 90 tablet 1   busPIRone (BUSPAR) 5 MG tablet TAKE 1 TABLET EVERY DAY AS NEEDED 90 tablet 0   cetirizine (ZYRTEC) 10 MG tablet Take 10 mg by  mouth daily.     ofloxacin (FLOXIN) 0.3 % OTIC solution Place 10 drops into the right ear daily. 5 mL 0   Vitamin D, Ergocalciferol, (DRISDOL) 1.25 MG (50000 UNIT) CAPS capsule TAKE 1 CAPSULE BY MOUTH 1 TIME A WEEK FOR 12 WEEKS THEN START OTC VITAMIN-D3 2000 UNIT DAILY 12 capsule 0   No current facility-administered medications for this visit.    No Known Allergies  Family History  Problem Relation Age of Onset   Breast cancer Neg Hx     Social History   Socioeconomic History   Marital status: Married    Spouse name: Not on file   Number of children: 3   Years of education: Not on file   Highest education level: Not on file  Occupational History   Occupation: Accounting  Tobacco Use   Smoking status: Never   Smokeless tobacco: Never  Vaping Use   Vaping status: Never Used  Substance and Sexual Activity   Alcohol use: Never   Drug use: Never   Sexual activity: Not on file  Other Topics Concern   Not on file  Social History Narrative   Not on file   Social Drivers of Health   Financial Resource Strain: Low Risk  (09/18/2022)   Overall Financial Resource Strain (CARDIA)    Difficulty of Paying Living Expenses:  Not hard at all  Food Insecurity: No Food Insecurity (09/18/2022)   Hunger Vital Sign    Worried About Running Out of Food in the Last Year: Never true    Ran Out of Food in the Last Year: Never true  Transportation Needs: No Transportation Needs (09/18/2022)   PRAPARE - Administrator, Civil Service (Medical): No    Lack of Transportation (Non-Medical): No  Physical Activity: Inactive (09/18/2022)   Exercise Vital Sign    Days of Exercise per Week: 0 days    Minutes of Exercise per Session: 0 min  Stress: No Stress Concern Present (09/18/2022)   [employer] of Occupational Health - Occupational Stress Questionnaire    Feeling of Stress : Not at all  Social Connections: Moderately Integrated (09/18/2022)   Social Connection and Isolation Panel  [NHANES]    Frequency of Communication with Friends and Family: More than three times a week    Frequency of Social Gatherings with Friends and Family: Once a week    Attends Religious Services: More than 4 times per year    Active Member of [employer] or Organizations: No    Attends Banker Meetings: Never    Marital Status: Married  Catering manager Violence: Not At Risk (09/18/2022)   Humiliation, Afraid, Rape, and Kick questionnaire    Fear of Current or Ex-Partner: No    Emotionally Abused: No    Physically Abused: No    Sexually Abused: No     Constitutional: Denies fever, malaise, fatigue, headache or abrupt weight changes.  HEENT: Patient reports wax buildup in the right ear.  Denies eye pain, eye redness, ear pain, ringing in the ears, runny nose, nasal congestion, bloody nose, or sore throat. Respiratory: Denies difficulty breathing, shortness of breath, cough or sputum production.   Cardiovascular: Denies chest pain, chest tightness, palpitations or swelling in the hands or feet.  Gastrointestinal: Denies abdominal pain, bloating, constipation, diarrhea or blood in the stool.  GU: Denies urgency, frequency, pain with urination, burning sensation, blood in urine, odor or discharge. Musculoskeletal: Patient reports neck and low back pain.  Denies decrease in range of motion, difficulty with gait, muscle pain or joint swelling.  Skin: Denies redness, rashes, lesions or ulcercations.  Neurological: Denies dizziness, difficulty with memory, difficulty with speech or problems with balance and coordination.  Psych: Patient has a history of anxiety.  Denies depression, SI/HI.  No other specific complaints in a complete review of systems (except as listed in HPI above).  Objective:   Physical Exam   BP (!) 148/88   Pulse 73   Ht 5' 3.5" (1.613 m)   Wt 154 lb (69.9 kg)   SpO2 98%   BMI 26.85 kg/m    Wt Readings from Last 3 Encounters:  11/20/22 153 lb (69.4 kg)   09/18/22 151 lb (68.5 kg)  08/20/22 155 lb 12.8 oz (70.7 kg)    General: Appears her stated age, overweight, in NAD. Skin: Warm, dry and intact.  HEENT: Head: normal shape and size; Eyes: sclera white, no icterus, conjunctiva pink, PERRLA and EOMs intact; Right Ear: Cerumen impaction Cardiovascular: Normal rate and rhythm. S1,S2 noted.  No murmur, rubs or gallops noted. No JVD or BLE edema. No carotid bruits noted. Pulmonary/Chest: Normal effort and positive vesicular breath sounds. No respiratory distress. No wheezes, rales or ronchi noted.  Musculoskeletal: Normal range of motion of the cervical and lumbar spine.  Bony tenderness noted over the lumbar spine.  No  difficulty with gait.  Neurological: Alert and oriented. Coordination normal.  Psychiatric: Mood and affect normal. Behavior is normal. Judgment and thought content normal.    BMET    Component Value Date/Time   NA 138 11/20/2022 0919   K 3.9 11/20/2022 0919   CL 104 11/20/2022 0919   CO2 26 11/20/2022 0919   GLUCOSE 99 11/20/2022 0919   BUN 11 11/20/2022 0919   CREATININE 0.81 11/20/2022 0919   CALCIUM 8.9 11/20/2022 0919   GFRNONAA 72 11/16/2019 1058   GFRAA 83 11/16/2019 1058    Lipid Panel     Component Value Date/Time   CHOL 138 08/06/2022 0854   TRIG 119 08/06/2022 0854   HDL 65 08/06/2022 0854   CHOLHDL 2.1 08/06/2022 0854   LDLCALC 52 08/06/2022 0854    CBC    Component Value Date/Time   WBC 4.8 11/20/2022 0919   RBC 4.76 11/20/2022 0919   HGB 14.7 11/20/2022 0919   HCT 44.3 11/20/2022 0919   PLT 177 11/20/2022 0919   MCV 93.1 11/20/2022 0919   MCH 30.9 11/20/2022 0919   MCHC 33.2 11/20/2022 0919   RDW 12.4 11/20/2022 0919   LYMPHSABS 1,946 05/21/2020 0913   EOSABS 108 05/21/2020 0913   BASOSABS 38 05/21/2020 0913    Hgb A1C Lab Results  Component Value Date   HGBA1C 5.7 (H) 08/06/2022          Assessment & Plan:   Cerumen impaction, right:  Manual lavage by CMA Can use Debrox  2 times weekly to prevent wax buildup  RTC in 6 months, follow-up chronic conditions [name], NP

## 2023-02-18 NOTE — Assessment & Plan Note (Signed)
Continue Tylenol OTC as needed 

## 2023-02-19 ENCOUNTER — Telehealth: Payer: Self-pay

## 2023-02-19 DIAGNOSIS — R053 Chronic cough: Secondary | ICD-10-CM

## 2023-02-19 LAB — LIPID PANEL
Cholesterol: 166 mg/dL (ref <200)
HDL: 74 mg/dL (ref >=50)
LDL Cholesterol (Calc): 71 mg/dL
Non-HDL Cholesterol (Calc): 92 mg/dL (ref <130)
Total CHOL/HDL Ratio: 2.2 (calc) (ref <5.0)
Triglycerides: 124 mg/dL (ref <150)

## 2023-02-19 LAB — COMPLETE METABOLIC PANEL WITH GFR
AG Ratio: 2.1 (calc) (ref 1.0–2.5)
ALT: 14 U/L (ref 6–29)
AST: 18 U/L (ref 10–35)
Albumin: 4.5 g/dL (ref 3.6–5.1)
Alkaline phosphatase (APISO): 61 U/L (ref 37–153)
BUN: 11 mg/dL (ref 7–25)
CO2: 28 mmol/L (ref 20–32)
Calcium: 9.1 mg/dL (ref 8.6–10.4)
Chloride: 104 mmol/L (ref 98–110)
Creat: 0.83 mg/dL (ref 0.60–1.00)
Globulin: 2.1 g/dL (ref 1.9–3.7)
Glucose, Bld: 93 mg/dL (ref 65–99)
Potassium: 4.3 mmol/L (ref 3.5–5.3)
Sodium: 139 mmol/L (ref 135–146)
Total Bilirubin: 0.6 mg/dL (ref 0.2–1.2)
Total Protein: 6.6 g/dL (ref 6.1–8.1)
eGFR: 73 mL/min/{1.73_m2} (ref >=60)

## 2023-02-19 LAB — CBC
HCT: 46 % — ABNORMAL HIGH (ref 35.0–45.0)
Hemoglobin: 14.9 g/dL (ref 11.7–15.5)
MCH: 29.9 pg (ref 27.0–33.0)
MCHC: 32.4 g/dL (ref 32.0–36.0)
MCV: 92.4 fL (ref 80.0–100.0)
MPV: 12.1 fL (ref 7.5–12.5)
Platelets: 188 10*3/uL (ref 140–400)
RBC: 4.98 10*6/uL (ref 3.80–5.10)
RDW: 12.4 % (ref 11.0–15.0)
WBC: 4.1 10*3/uL (ref 3.8–10.8)

## 2023-02-19 LAB — HEMOGLOBIN A1C
Hgb A1c MFr Bld: 5.6 %{Hb} (ref <5.7)
Mean Plasma Glucose: 114 mg/dL
eAG (mmol/L): 6.3 mmol/L

## 2023-02-19 NOTE — Telephone Encounter (Signed)
Copied from CRM [id number]. Topic: General - Other >> Feb 19, 2023 11:05 AM Franchot Heidelberg wrote: Reason for CRM: Pt called requesting to have an Xray of her lower back per discussion with PCP.

## 2023-02-19 NOTE — Telephone Encounter (Signed)
Spoke with patient and she is ok waiting until Monday since there is no xray available.  Advised her she could have it done elsewhere but she said she could wait until Monday.

## 2023-02-25 ENCOUNTER — Other Ambulatory Visit: Payer: Self-pay | Admitting: Internal Medicine

## 2023-02-25 ENCOUNTER — Ambulatory Visit
Admission: RE | Admit: 2023-02-25 | Discharge: 2023-02-25 | Disposition: A | Discharge status: Home / Self Care | Payer: Medicare Other | Attending: Internal Medicine | Admitting: Internal Medicine

## 2023-02-25 ENCOUNTER — Ambulatory Visit
Admission: RE | Admit: 2023-02-25 | Discharge: 2023-02-25 | Disposition: A | Discharge status: Home / Self Care | Payer: Medicare Other | Source: Ambulatory Visit | Attending: Internal Medicine | Admitting: Internal Medicine

## 2023-02-25 DIAGNOSIS — M545 Low back pain, unspecified: Secondary | ICD-10-CM | POA: Diagnosis present

## 2023-02-25 DIAGNOSIS — R053 Chronic cough: Secondary | ICD-10-CM

## 2023-03-18 ENCOUNTER — Other Ambulatory Visit: Payer: Self-pay | Admitting: Internal Medicine

## 2023-03-18 DIAGNOSIS — E782 Mixed hyperlipidemia: Secondary | ICD-10-CM

## 2023-03-18 NOTE — Telephone Encounter (Signed)
Requested Prescriptions  Pending Prescriptions Disp Refills   atorvastatin (LIPITOR) 10 MG tablet [Pharmacy Med Name: Atorvastatin Calcium Oral Tablet 10 MG] 90 tablet 1    Sig: TAKE 1 TABLET EVERY DAY     Cardiovascular:  Antilipid - Statins Failed - 03/18/2023  8:51 AM      Failed - Lipid Panel in normal range within the last 12 months    Cholesterol  Date Value Ref Range Status  02/18/2023 166 <200 mg/dL Final   LDL Cholesterol (Calc)  Date Value Ref Range Status  02/18/2023 71 mg/dL (calc) Final    Comment:    Reference range: <100 . Desirable range <100 mg/dL for primary prevention;   <70 mg/dL for patients with CHD or diabetic patients  with > or = 2 CHD risk factors. Marland Kitchen LDL-C is now calculated using the Martin-Hopkins  calculation, which is a validated novel method providing  better accuracy than the Friedewald equation in the  estimation of LDL-C.  Horald Pollen et al. Lenox Ahr. 0102;725(36): 2061-2068  (http://education.QuestDiagn[facility].com/faq/FAQ164)    HDL  Date Value Ref Range Status  02/18/2023 74 > OR = 50 mg/dL Final   Triglycerides  Date Value Ref Range Status  02/18/2023 124 <150 mg/dL Final         Passed - Patient is not pregnant      Passed - Valid encounter within last 12 months    Recent Outpatient Visits           4 weeks ago Prediabetes   [place] [facility] [place], Salvadore Oxford, NP   3 months ago Paresthesia of right upper extremity   [place] [facility] [place], Salvadore Oxford, NP   7 months ago Acute sinusitis, recurrence not specified, unspecified location   [place] [facility] [place], Oswaldo Conroy, PA-C   7 months ago Essential hypertension   [place] [facility] [place], Salvadore Oxford, NP   1 year ago Need for influenza vaccination   [place] [facility] [place], Salvadore Oxford, NP       Future Appointments             In 5 months [place], Salvadore Oxford, NP  [place] [facility], [facility]

## 2023-03-29 ENCOUNTER — Ambulatory Visit: Payer: Medicare Other | Admitting: Internal Medicine

## 2023-04-01 ENCOUNTER — Encounter: Payer: Self-pay | Admitting: Internal Medicine

## 2023-04-01 ENCOUNTER — Ambulatory Visit (INDEPENDENT_AMBULATORY_CARE_PROVIDER_SITE_OTHER): Payer: Medicare Other | Admitting: Internal Medicine

## 2023-04-01 VITALS — BP 118/70 | HR 72 | Temp 97.8°F | Ht 63.5 in | Wt 154.6 lb

## 2023-04-01 DIAGNOSIS — J00 Acute nasopharyngitis [common cold]: Secondary | ICD-10-CM | POA: Diagnosis not present

## 2023-04-01 MED ORDER — AZITHROMYCIN 250 MG PO TABS: ORAL_TABLET | ORAL | 0 refills | Status: DC

## 2023-04-01 NOTE — Patient Instructions (Signed)

## 2023-04-01 NOTE — Progress Notes (Signed)
Subjective:    Patient ID: Rhonda Lamb, female    DOB: 08-Jan-1946, 78 y.o.   MRN: [id number]  HPI  Discussed the use of AI scribe software for clinical note transcription with the patient, who gave verbal consent to proceed.   The patient presents with persistent neck swelling following a recent upper respiratory infection.  Initially, she experienced a sore throat a few weeks ago, which resolved without medical intervention. This was followed by sinus pressure and discolored nasal discharge, which she describes as atypical for a common cold. She initially had a productive cough with discolored mucus, but this has since resolved.  Following the resolution of the sore throat, she noticed a persistent knot in her neck, which has decreased in size but remains painful upon palpation. The neck swelling has persisted for approximately two weeks, causing concern due to its duration.  She has not taken any over-the-counter medications for her symptoms, citing poor tolerance to such treatments. She is planning to travel out of town next week and is seeking resolution of her symptoms before then.  No headaches, runny nose, ear pain, nausea, vomiting, diarrhea, fever, chills, body aches, or difficulty swallowing.       Review of Systems   Past Medical History:  Diagnosis Date   Motion sickness    circular motion   Vertigo    none in over 1 year    Current Outpatient Medications  Medication Sig Dispense Refill   atorvastatin (LIPITOR) 10 MG tablet TAKE 1 TABLET EVERY DAY 90 tablet 1   busPIRone (BUSPAR) 5 MG tablet TAKE 1 TABLET EVERY DAY AS NEEDED 90 tablet 0   cetirizine (ZYRTEC) 10 MG tablet Take 1 tablet (10 mg total) by mouth daily. 90 tablet 1   ofloxacin (FLOXIN) 0.3 % OTIC solution Place 10 drops into the right ear daily. 5 mL 0   omeprazole (PRILOSEC) 20 MG capsule Take 1 capsule (20 mg total) by mouth daily. 90 capsule 1   Vitamin D, Ergocalciferol, (DRISDOL) 1.25 MG  (50000 UNIT) CAPS capsule TAKE 1 CAPSULE BY MOUTH 1 TIME A WEEK FOR 12 WEEKS THEN START OTC VITAMIN-D3 2000 UNIT DAILY 12 capsule 0   No current facility-administered medications for this visit.    No Known Allergies  Family History  Problem Relation Age of Onset   Breast cancer Neg Hx     Social History   Socioeconomic History   Marital status: Married    Spouse name: Not on file   Number of children: 3   Years of education: Not on file   Highest education level: Not on file  Occupational History   Occupation: Accounting  Tobacco Use   Smoking status: Never   Smokeless tobacco: Never  Vaping Use   Vaping status: Never Used  Substance and Sexual Activity   Alcohol use: Never   Drug use: Never   Sexual activity: Not on file  Other Topics Concern   Not on file  Social History Narrative   Not on file   Social Drivers of Health   Financial Resource Strain: Low Risk  (09/18/2022)   Overall Financial Resource Strain (CARDIA)    Difficulty of Paying Living Expenses: Not hard at all  Food Insecurity: No Food Insecurity (09/18/2022)   Hunger Vital Sign    Worried About Running Out of Food in the Last Year: Never true    Ran Out of Food in the Last Year: Never true  Transportation Needs: No Transportation Needs (09/18/2022)  PRAPARE - Administrator, Civil Service (Medical): No    Lack of Transportation (Non-Medical): No  Physical Activity: Inactive (09/18/2022)   Exercise Vital Sign    Days of Exercise per Week: 0 days    Minutes of Exercise per Session: 0 min  Stress: No Stress Concern Present (09/18/2022)   [employer] of Occupational Health - Occupational Stress Questionnaire    Feeling of Stress : Not at all  Social Connections: Moderately Integrated (09/18/2022)   Social Connection and Isolation Panel [NHANES]    Frequency of Communication with Friends and Family: More than three times a week    Frequency of Social Gatherings with Friends and Family:  Once a week    Attends Religious Services: More than 4 times per year    Active Member of [employer] or Organizations: No    Attends Banker Meetings: Never    Marital Status: Married  Catering manager Violence: Not At Risk (09/18/2022)   Humiliation, Afraid, Rape, and Kick questionnaire    Fear of Current or Ex-Partner: No    Emotionally Abused: No    Physically Abused: No    Sexually Abused: No     Constitutional: Denies fever, malaise, fatigue, headache or abrupt weight changes.  HEENT: Pt reports sinus pressure, nasal congestion, sore throat. Denies eye pain, eye redness, ear pain, ringing in the ears, wax buildup, runny nose, bloody nose. Respiratory: Pt reports cough. Denies difficulty breathing, shortness of breath, or sputum production.   Cardiovascular: Denies chest pain, chest tightness, palpitations or swelling in the hands or feet.  Gastrointestinal: Denies abdominal pain, bloating, constipation, diarrhea or blood in the stool.  Musculoskeletal: Denies decrease in range of motion, difficulty with gait, muscle pain or joint pain and swelling.  Skin: Denies redness, rashes, lesions or ulcercations.  Neurological: Denies dizziness, difficulty with memory, difficulty with speech or problems with balance and coordination.    No other specific complaints in a complete review of systems (except as listed in HPI above).      Objective:   Physical Exam  BP 118/70 (BP Location: Left Arm, Patient Position: Sitting, Cuff Size: Normal)   Pulse 72   Temp 97.8 F (36.6 C)   Ht 5' 3.5" (1.613 m)   Wt 154 lb 9.6 oz (70.1 kg)   SpO2 99%   BMI 26.96 kg/m    Wt Readings from Last 3 Encounters:  02/18/23 154 lb (69.9 kg)  11/20/22 153 lb (69.4 kg)  09/18/22 151 lb (68.5 kg)    General: Appears her stated age, overweight, in NAD. Skin: Warm, dry and intact. No rashes noted. HEENT: Head: normal shape and size, no sinus pressure noted; Eyes: sclera white, no icterus,  conjunctiva pink, PERRLA and EOMs intact; Nose: mucosa pink and moist, septum midline; Throat/Mouth: Teeth present, mucosa pink and moist, + PND, no exudate, lesions or ulcerations noted.  Neck: Right anterior adenopathy noted. Cardiovascular: Normal rate and rhythm.  Pulmonary/Chest: Normal effort and positive vesicular breath sounds. No respiratory distress. No wheezes, rales or ronchi noted.  Musculoskeletal: No difficulty with gait.  Neurological: Alert and oriented.   BMET    Component Value Date/Time   NA 139 02/18/2023 0847   K 4.3 02/18/2023 0847   CL 104 02/18/2023 0847   CO2 28 02/18/2023 0847   GLUCOSE 93 02/18/2023 0847   BUN 11 02/18/2023 0847   CREATININE 0.83 02/18/2023 0847   CALCIUM 9.1 02/18/2023 0847   GFRNONAA 72 11/16/2019 1058  GFRAA 83 11/16/2019 1058    Lipid Panel     Component Value Date/Time   CHOL 166 02/18/2023 0847   TRIG 124 02/18/2023 0847   HDL 74 02/18/2023 0847   CHOLHDL 2.2 02/18/2023 0847   LDLCALC 71 02/18/2023 0847    CBC    Component Value Date/Time   WBC 4.1 02/18/2023 0847   RBC 4.98 02/18/2023 0847   HGB 14.9 02/18/2023 0847   HCT 46.0 (H) 02/18/2023 0847   PLT 188 02/18/2023 0847   MCV 92.4 02/18/2023 0847   MCH 29.9 02/18/2023 0847   MCHC 32.4 02/18/2023 0847   RDW 12.4 02/18/2023 0847   LYMPHSABS 1,946 05/21/2020 0913   EOSABS 108 05/21/2020 0913   BASOSABS 38 05/21/2020 0913    Hgb A1C Lab Results  Component Value Date   HGBA1C 5.6 02/18/2023            Assessment & Plan:  Assessment and Plan    Upper Respiratory Infection Resolving symptoms of sinus pressure and discolored mucus. Persistent cervical lymphadenopathy, non-tender and smooth, likely reactive. Patient prefers to avoid antibiotics if possible, but has been symptomatic for two weeks. -Prescribe Azithromycin (Z-Pak) for patient to start if symptoms worsen or do not continue to improve.  Cervical Lymphadenopathy Likely reactive to recent  upper respiratory infection. Node is small, smooth, and non-tender. -Monitor for resolution with treatment of upper respiratory infection.        RTC in 5 months for follow-up of chronic conditions [name], NP

## 2023-04-09 ENCOUNTER — Ambulatory Visit: Payer: Self-pay

## 2023-04-09 NOTE — Telephone Encounter (Signed)
 Message from [place] S sent at 04/09/2023  1:58 PM EST  Summary: Cough, burning throat   The patient called in stating she saw her provider last week for upper respiratory issues but continues to still have a cough and burning throat. She was put on a z pack and was doing better but she states everything was re aggravated once the medicine was gone. Please assist patient further.        Called pt and LM on VM to CB.

## 2023-04-09 NOTE — Telephone Encounter (Signed)
   Chief Complaint: recent treatment- 04/01/23- Nasopharyngitis- sore throat, cough Symptoms: Patient reports she was recently treated with Zpak for 5 days and got better- but now symptoms have returned.  Frequency: Started antibiotic last Thrusday- symptoms are back- not as bad Pertinent Negatives: Patient denies fever Disposition: [] ED /[] Urgent Care (no appt availability in office) / [] Appointment(In office/virtual)/ []  [facility]/ [] Home Care/ [] Refused Recommended Disposition /[] [facility]/ [x]  Follow-up with PCP Additional Notes: Patient is requesting treatment- no open appointment- patient requested provider retreat- will send message for review since patient was recently in office  Reason for Disposition  [1] Caller has URGENT question AND [2] triager unable to answer question  Answer Assessment - Initial Assessment Questions 1. INFECTION: What infection is the antibiotic being given for?     Nasopharyngitis  2. ANTIBIOTIC: What antibiotic are you taking How many times per day?     Z pak- 5 days- Th-M 3. DURATION: When was the antibiotic started?     Last Thursday 4. MAIN CONCERN OR SYMPTOM:  What is your main concern right now?     Cough, sore throat 5. BETTER-SAME-WORSE: Are you getting better, staying the same, or getting worse compared to when you first started the antibiotics? If getting worse, ask: In what way?      Patient thought she was better- lymph node reduced, mucus seemed to clear- but now symptoms have returned 6. FEVER: Do you have a fever? If Yes, ask: What is your temperature, how was it measured, and when did it start?     no 7. SYMPTOMS: Are there any other symptoms you're concerned about? If Yes, ask: When did it start?     Cough- mucus is not thick and infected- but clear now- with drainage- sore throat 8. FOLLOW-UP APPOINTMENT: Do you have a follow-up appointment with your doctor?     no  Protocols used:  Infection on Antibiotic Follow-up Call-A-[facility]

## 2023-04-12 NOTE — Telephone Encounter (Signed)
 How is she feeling today?  I could offer her some prednisone , likely that antibiotics don't need to be repeated.

## 2023-04-13 NOTE — Telephone Encounter (Signed)
Okay, she will need to contact us when she is back in town and let us know if she needs anything further

## 2023-08-11 ENCOUNTER — Other Ambulatory Visit: Payer: Self-pay | Admitting: Internal Medicine

## 2023-08-11 DIAGNOSIS — E782 Mixed hyperlipidemia: Secondary | ICD-10-CM

## 2023-08-12 NOTE — Telephone Encounter (Signed)
 Requested Prescriptions  Pending Prescriptions Disp Refills   atorvastatin  (LIPITOR) 10 MG tablet [Pharmacy Med Name: Atorvastatin  Calcium  Oral Tablet 10 MG] 90 tablet 0    Sig: TAKE 1 TABLET EVERY DAY     Cardiovascular:  Antilipid - Statins Failed - 08/12/2023  1:27 PM      Failed - Valid encounter within last 12 months    Recent Outpatient Visits   None     Future Appointments             In 1 week Baity, Rankin Buzzard, NP [name]            Failed - Lipid Panel in normal range within the last 12 months    Cholesterol  Date Value Ref Range Status  02/18/2023 166 <200 mg/dL Final   LDL Cholesterol (Calc)  Date Value Ref Range Status  02/18/2023 71 mg/dL (calc) Final    Comment:    Reference range: <100 . Desirable range <100 mg/dL for primary prevention;   <70 mg/dL for patients with CHD or diabetic patients  with > or = 2 CHD risk factors. Aaron Aas LDL-C is now calculated using the Martin-Hopkins  calculation, which is a validated novel method providing  better accuracy than the Friedewald equation in the  estimation of LDL-C.  Melinda Sprawls et al. Erroll Heard. 1610;960(45): 2061-2068  (http://education.[facility].com/faq/FAQ164)    HDL  Date Value Ref Range Status  02/18/2023 74 > OR = 50 mg/dL Final   Triglycerides  Date Value Ref Range Status  02/18/2023 124 <150 mg/dL Final         Passed - Patient is not pregnant

## 2023-08-20 ENCOUNTER — Ambulatory Visit (INDEPENDENT_AMBULATORY_CARE_PROVIDER_SITE_OTHER): Payer: Self-pay | Admitting: Internal Medicine

## 2023-08-20 ENCOUNTER — Encounter: Payer: Self-pay | Admitting: Internal Medicine

## 2023-08-20 VITALS — BP 128/78 | Ht 63.5 in | Wt 154.4 lb

## 2023-08-20 DIAGNOSIS — R7303 Prediabetes: Secondary | ICD-10-CM

## 2023-08-20 DIAGNOSIS — M810 Age-related osteoporosis without current pathological fracture: Secondary | ICD-10-CM | POA: Diagnosis not present

## 2023-08-20 DIAGNOSIS — R42 Dizziness and giddiness: Secondary | ICD-10-CM | POA: Insufficient documentation

## 2023-08-20 DIAGNOSIS — H6122 Impacted cerumen, left ear: Secondary | ICD-10-CM

## 2023-08-20 DIAGNOSIS — E782 Mixed hyperlipidemia: Secondary | ICD-10-CM | POA: Diagnosis not present

## 2023-08-20 DIAGNOSIS — R053 Chronic cough: Secondary | ICD-10-CM

## 2023-08-20 DIAGNOSIS — I10 Essential (primary) hypertension: Secondary | ICD-10-CM

## 2023-08-20 DIAGNOSIS — E663 Overweight: Secondary | ICD-10-CM

## 2023-08-20 DIAGNOSIS — F411 Generalized anxiety disorder: Secondary | ICD-10-CM

## 2023-08-20 DIAGNOSIS — M47816 Spondylosis without myelopathy or radiculopathy, lumbar region: Secondary | ICD-10-CM

## 2023-08-20 DIAGNOSIS — E559 Vitamin D deficiency, unspecified: Secondary | ICD-10-CM

## 2023-08-20 DIAGNOSIS — Z6826 Body mass index (BMI) 26.0-26.9, adult: Secondary | ICD-10-CM

## 2023-08-20 DIAGNOSIS — M1990 Unspecified osteoarthritis, unspecified site: Secondary | ICD-10-CM | POA: Insufficient documentation

## 2023-08-20 MED ORDER — CETIRIZINE HCL 10 MG PO TABS: 10.0000 mg | ORAL_TABLET | Freq: Every day | ORAL | 1 refills | Status: DC

## 2023-08-20 MED ORDER — OMEPRAZOLE 20 MG PO CPDR: 20.0000 mg | DELAYED_RELEASE_CAPSULE | Freq: Every day | ORAL | 1 refills | Status: DC

## 2023-08-20 MED ORDER — OYSTER SHELL CALCIUM/D3 500-5 MG-MCG PO TABS: 2.0000 | ORAL_TABLET | Freq: Every day | ORAL | 1 refills | Status: DC

## 2023-08-20 MED ORDER — MECLIZINE HCL 25 MG PO TABS
25.0000 mg | ORAL_TABLET | Freq: Three times a day (TID) | ORAL | 1 refills | Status: DC | PRN
Start: 2023-08-20 — End: 2023-11-24

## 2023-08-20 NOTE — Assessment & Plan Note (Signed)
 She has buspirone  5 mg to take daily as needed Support offered

## 2023-08-20 NOTE — Assessment & Plan Note (Signed)
 Controlled off meds We will continue to monitor at this time Reinforced DASH diet and exercise for weight loss

## 2023-08-20 NOTE — Assessment & Plan Note (Signed)
 Controlled on omeprazole  20 mg daily and cetirizine  10 mg daily We will monitor

## 2023-08-20 NOTE — Assessment & Plan Note (Signed)
 Will have her start taking calcium  and 1000 mg and vitamin D  400 units daily Encouraged daily weightbearing exercise Bone density ordered-she will call to schedule

## 2023-08-20 NOTE — Assessment & Plan Note (Signed)
 Encouraged regular stretching and core strengthening Encourage weight loss as this can help reduce joint pain Continue Tylenol OTC

## 2023-08-20 NOTE — Assessment & Plan Note (Signed)
 Will trial meclizine 25 mg 3 times daily as needed Will monitor

## 2023-08-20 NOTE — Progress Notes (Signed)
 Subjective:    Patient ID: Gareld June, female    DOB: 12/29/1945, 78 y.o.   MRN: [id number]  HPI  Patient presents to clinic today for 32-month follow-up of chronic conditions.  HTN: Her BP today is 128/78.  She is not taking any antihypertensive medications at this time.  ECG from 11/2022 reviewed.  Osteoporosis: She had been on ibandronate  in the past but is not currently taking this.  She is not currently taking any calcium  and vit D.  She tries to get weightbearing exercise.  Bone density from 12/2019 reviewed.  HLD: Her last LDL was 71, triglycerides 782, 01/2023.  She denies myalgias on atorvastatin .  She tries to consume low-fat diet.  Chronic cough: Silent reflux. She is taking omeprazole  and zyrtec  OTC. There is no upper GI on file.   Anxiety: Intermittent, managed on buspirone  as needed.  She is not currently seeing a therapist.  She denies depression, SI/HI.  OA: Mainly in her lower back. She takes tylenol OTC with some relief of symptoms. She does not follow with orthopedics.  Prediabetes: Her last A1c was 5.6%, 01/2023.  She is not taking any oral diabetic medication at this time.  She does not check her sugars.  She also reports frequent headaches. This started years ago but she does feel like they have become more frequent. She reports this typically occurs 1 x month. The headache is located in the top of her head. She describes the pain as pressure. She denies vision changes, sensitivity to light or sound but does have some nausea, vomiting and dizziness. She denies neck pain. She describes the dizziness as a sensation that room is spinning with any movement.  Review of Systems     Past Medical History:  Diagnosis Date   Motion sickness    circular motion   Vertigo    none in over 1 year    Current Outpatient Medications  Medication Sig Dispense Refill   atorvastatin  (LIPITOR) 10 MG tablet TAKE 1 TABLET EVERY DAY 90 tablet 0   azithromycin  (ZITHROMAX ) 250  MG tablet Take 2 tabs today, then 1 tab daily x 4 days 6 tablet 0   busPIRone  (BUSPAR ) 5 MG tablet TAKE 1 TABLET EVERY DAY AS NEEDED (Patient not taking: Reported on 04/01/2023) 90 tablet 0   cetirizine  (ZYRTEC ) 10 MG tablet Take 1 tablet (10 mg total) by mouth daily. 90 tablet 1   omeprazole  (PRILOSEC) 20 MG capsule Take 1 capsule (20 mg total) by mouth daily. 90 capsule 1   Vitamin D , Ergocalciferol , (DRISDOL ) 1.25 MG (50000 UNIT) CAPS capsule TAKE 1 CAPSULE BY MOUTH 1 TIME A WEEK FOR 12 WEEKS THEN START OTC VITAMIN-D3 2000 UNIT DAILY 12 capsule 0   No current facility-administered medications for this visit.    No Known Allergies  Family History  Problem Relation Age of Onset   Breast cancer Neg Hx     Social History   Socioeconomic History   Marital status: Married    Spouse name: Not on file   Number of children: 3   Years of education: Not on file   Highest education level: Not on file  Occupational History   Occupation: Accounting  Tobacco Use   Smoking status: Never   Smokeless tobacco: Never  Vaping Use   Vaping status: Never Used  Substance and Sexual Activity   Alcohol use: Never   Drug use: Never   Sexual activity: Not on file  Other Topics Concern  Not on file  Social History Narrative   Not on file   Social Drivers of Health   Financial Resource Strain: Low Risk  (09/18/2022)   Overall Financial Resource Strain (CARDIA)    Difficulty of Paying Living Expenses: Not hard at all  Food Insecurity: No Food Insecurity (09/18/2022)   Hunger Vital Sign    Worried About Running Out of Food in the Last Year: Never true    Ran Out of Food in the Last Year: Never true  Transportation Needs: No Transportation Needs (09/18/2022)   PRAPARE - Administrator, Civil Service (Medical): No    Lack of Transportation (Non-Medical): No  Physical Activity: Inactive (09/18/2022)   Exercise Vital Sign    Days of Exercise per Week: 0 days    Minutes of Exercise per  Session: 0 min  Stress: No Stress Concern Present (09/18/2022)   [employer] of Occupational Health - Occupational Stress Questionnaire    Feeling of Stress : Not at all  Social Connections: Moderately Integrated (09/18/2022)   Social Connection and Isolation Panel    Frequency of Communication with Friends and Family: More than three times a week    Frequency of Social Gatherings with Friends and Family: Once a week    Attends Religious Services: More than 4 times per year    Active Member of [employer] or Organizations: No    Attends Banker Meetings: Never    Marital Status: Married  Catering manager Violence: Not At Risk (09/18/2022)   Humiliation, Afraid, Rape, and Kick questionnaire    Fear of Current or Ex-Partner: No    Emotionally Abused: No    Physically Abused: No    Sexually Abused: No     Constitutional: Pt reports intermittent headaches. Denies fever, malaise, fatigue, headache or abrupt weight changes.  HEENT: Patient reports ear fullness.  Denies eye pain, eye redness, ear pain, ringing in the ears, runny nose, nasal congestion, bloody nose, or sore throat. Respiratory: Patient reports intermittent cough.  Denies difficulty breathing, shortness of breath, or sputum production.   Cardiovascular: Denies chest pain, chest tightness, palpitations or swelling in the hands or feet.  Gastrointestinal: Denies abdominal pain, bloating, constipation, diarrhea or blood in the stool.  GU: Denies urgency, frequency, pain with urination, burning sensation, blood in urine, odor or discharge. Musculoskeletal: Patient reports low back pain.  Denies decrease in range of motion, difficulty with gait, muscle pain or joint swelling.  Skin: Denies redness, rashes, lesions or ulcercations.  Neurological: Patient reports dizziness.  Denies difficulty with memory, difficulty with speech or problems with balance and coordination.  Psych: Patient has a history of anxiety.  Denies  depression, SI/HI.  No other specific complaints in a complete review of systems (except as listed in HPI above).  Objective:   Physical Exam  BP 128/78 (BP Location: Left Arm, Patient Position: Sitting, Cuff Size: Normal)   Ht 5' 3.5 (1.613 m)   Wt 154 lb 6.4 oz (70 kg)   BMI 26.92 kg/m     Wt Readings from Last 3 Encounters:  04/01/23 154 lb 9.6 oz (70.1 kg)  02/18/23 154 lb (69.9 kg)  11/20/22 153 lb (69.4 kg)    General: Appears her stated age, overweight, in NAD. Skin: Warm, dry and intact.  HEENT: Head: normal shape and size; Eyes: sclera white, no icterus, conjunctiva pink, PERRLA and EOMs intact; Left Ear: cerumen impaction Cardiovascular: Normal rate and rhythm. S1,S2 noted.  No murmur, rubs or gallops  noted. No JVD or BLE edema. No carotid bruits noted. Pulmonary/Chest: Normal effort and positive vesicular breath sounds. No respiratory distress. No wheezes, rales or ronchi noted.  Musculoskeletal: Bony tenderness noted over the lumbar spine.  No difficulty with gait.  Neurological: Alert and oriented. Coordination normal.  Psychiatric: Mood and affect normal. Behavior is normal. Judgment and thought content normal.    BMET    Component Value Date/Time   NA 139 02/18/2023 0847   K 4.3 02/18/2023 0847   CL 104 02/18/2023 0847   CO2 28 02/18/2023 0847   GLUCOSE 93 02/18/2023 0847   BUN 11 02/18/2023 0847   CREATININE 0.83 02/18/2023 0847   CALCIUM  9.1 02/18/2023 0847   GFRNONAA 72 11/16/2019 1058   GFRAA 83 11/16/2019 1058    Lipid Panel     Component Value Date/Time   CHOL 166 02/18/2023 0847   TRIG 124 02/18/2023 0847   HDL 74 02/18/2023 0847   CHOLHDL 2.2 02/18/2023 0847   LDLCALC 71 02/18/2023 0847    CBC    Component Value Date/Time   WBC 4.1 02/18/2023 0847   RBC 4.98 02/18/2023 0847   HGB 14.9 02/18/2023 0847   HCT 46.0 (H) 02/18/2023 0847   PLT 188 02/18/2023 0847   MCV 92.4 02/18/2023 0847   MCH 29.9 02/18/2023 0847   MCHC 32.4  02/18/2023 0847   RDW 12.4 02/18/2023 0847   LYMPHSABS 1,946 05/21/2020 0913   EOSABS 108 05/21/2020 0913   BASOSABS 38 05/21/2020 0913    Hgb A1C Lab Results  Component Value Date   HGBA1C 5.6 02/18/2023          Assessment & Plan:   Cerumen impaction of left ear:  Manual lavage by CMA Advised to use Debrox 2 times weekly to prevent further wax buildup  RTC in 6 months, follow-up chronic conditions Helayne Lo, NP

## 2023-08-20 NOTE — Patient Instructions (Signed)

## 2023-08-20 NOTE — Assessment & Plan Note (Signed)
 A1c today Encourage low-carb diet and exercise for weight loss

## 2023-08-20 NOTE — Assessment & Plan Note (Signed)
 C-Met and lipid profile today Encouraged to consume low-fat diet Continue atorvastatin  10 mg daily, will adjust if needed based on labs

## 2023-08-20 NOTE — Assessment & Plan Note (Signed)
 Encouraged diet and exercise for weight loss ?

## 2023-08-21 LAB — LIPID PANEL
Cholesterol: 167 mg/dL (ref <200)
HDL: 66 mg/dL (ref >=50)
LDL Cholesterol (Calc): 76 mg/dL
Non-HDL Cholesterol (Calc): 101 mg/dL (ref <130)
Total CHOL/HDL Ratio: 2.5 (calc) (ref <5.0)
Triglycerides: 156 mg/dL — ABNORMAL HIGH (ref <150)

## 2023-08-21 LAB — COMPREHENSIVE METABOLIC PANEL WITH GFR
AG Ratio: 1.9 (calc) (ref 1.0–2.5)
ALT: 11 U/L (ref 6–29)
AST: 16 U/L (ref 10–35)
Albumin: 4.3 g/dL (ref 3.6–5.1)
Alkaline phosphatase (APISO): 66 U/L (ref 37–153)
BUN: 12 mg/dL (ref 7–25)
CO2: 26 mmol/L (ref 20–32)
Calcium: 8.9 mg/dL (ref 8.6–10.4)
Chloride: 105 mmol/L (ref 98–110)
Creat: 0.83 mg/dL (ref 0.60–1.00)
Globulin: 2.3 g/dL (ref 1.9–3.7)
Glucose, Bld: 94 mg/dL (ref 65–99)
Potassium: 4.2 mmol/L (ref 3.5–5.3)
Sodium: 139 mmol/L (ref 135–146)
Total Bilirubin: 0.5 mg/dL (ref 0.2–1.2)
Total Protein: 6.6 g/dL (ref 6.1–8.1)
eGFR: 73 mL/min/{1.73_m2} (ref >=60)

## 2023-08-21 LAB — CBC
HCT: 46.8 % — ABNORMAL HIGH (ref 35.0–45.0)
Hemoglobin: 15 g/dL (ref 11.7–15.5)
MCH: 29.8 pg (ref 27.0–33.0)
MCHC: 32.1 g/dL (ref 32.0–36.0)
MCV: 93 fL (ref 80.0–100.0)
MPV: 11.5 fL (ref 7.5–12.5)
Platelets: 187 10*3/uL (ref 140–400)
RBC: 5.03 10*6/uL (ref 3.80–5.10)
RDW: 12.8 % (ref 11.0–15.0)
WBC: 4.7 10*3/uL (ref 3.8–10.8)

## 2023-08-21 LAB — HEMOGLOBIN A1C
Hgb A1c MFr Bld: 5.8 % — ABNORMAL HIGH (ref <5.7)
Mean Plasma Glucose: 120 mg/dL
eAG (mmol/L): 6.6 mmol/L

## 2023-08-21 LAB — VITAMIN D 25 HYDROXY (VIT D DEFICIENCY, FRACTURES): Vit D, 25-Hydroxy: 38 ng/mL (ref 30–100)

## 2023-08-23 ENCOUNTER — Ambulatory Visit: Payer: Self-pay | Admitting: Internal Medicine

## 2023-10-07 ENCOUNTER — Ambulatory Visit: Payer: Medicare Other

## 2023-10-07 VITALS — Ht 63.5 in | Wt 149.0 lb

## 2023-10-07 DIAGNOSIS — Z0000 Encounter for general adult medical examination without abnormal findings: Secondary | ICD-10-CM | POA: Diagnosis not present

## 2023-10-07 NOTE — Patient Instructions (Addendum)
 Ms. Choma , Thank you for taking time out of your busy schedule to complete your Annual Wellness Visit with me. I enjoyed our conversation and look forward to speaking with you again next year. I, as well as your care team,  appreciate your ongoing commitment to your health goals. Please review the following plan we discussed and let me know if I can assist you in the future. Your Game plan/ To Do List    Referrals: If you haven't heard from the office you've been referred to, please reach out to them at the phone provided.   Follow up Visits: We will see or speak with you next year for your Next Medicare AWV with our clinical staff 10/13/24 @ 3:20p Have you seen your provider in the last 6 months (3 months if uncontrolled diabetes)? Appointment scheduled for 02/21/24 @ 8a  Clinician Recommendations:  Aim for 30 minutes of exercise or brisk walking, 6-8 glasses of water , and 5 servings of fruits and vegetables each day.       This is a list of the screenings recommended for you:  Health Maintenance  Topic Date Due   Zoster (Shingles) Vaccine (1 of 2) Never done   DTaP/Tdap/Td vaccine (2 - Td or Tdap) 02/10/2021   Flu Shot  10/01/2023   Medicare Annual Wellness Visit  10/06/2024   Colon Cancer Screening  06/07/2030   Pneumococcal Vaccine for age over 83  Completed   DEXA scan (bone density measurement)  Completed   Hepatitis C Screening  Completed   Hepatitis B Vaccine  Aged Out   HPV Vaccine  Aged Out   Meningitis B Vaccine  Aged Out   COVID-19 Vaccine  Discontinued    Advanced directives: (Copy Requested) Please bring a copy of your health care power of attorney and living will to the office to be added to your chart at your convenience. You can mail to [facility] [address] [place], [place] [address] or email to [email or web address] Advance Care Planning is important because it:  [x]  Makes sure you receive the medical care that is consistent with your  values, goals, and preferences  [x]  It provides guidance to your family and loved ones and reduces their decisional burden about whether or not they are making the right decisions based on your wishes.  Follow the link provided in your after visit summary or read over the paperwork we have mailed to you to help you started getting your Advance Directives in place. If you need assistance in completing these, please reach out to us  so that we can help you!  See attachments for Preventive Care and Fall Prevention Tips.

## 2023-10-07 NOTE — Progress Notes (Signed)
 Subjective:   Rhonda Lamb is a 78 y.o. who presents for a Medicare Wellness preventive visit.  As a reminder, Annual Wellness Visits don't include a physical exam, and some assessments may be limited, especially if this visit is performed virtually. We may recommend an in-person follow-up visit with your provider if needed.  Visit Complete: Virtual I connected with  Rhonda Lamb on 10/07/23 by a audio enabled telemedicine application and verified that I am speaking with the correct person using two identifiers.  Patient Location: Home  Provider Location: Home Office  I discussed the limitations of evaluation and management by telemedicine. The patient expressed understanding and agreed to proceed.  Vital Signs: Because this visit was a virtual/telehealth visit, some criteria may be missing or patient reported. Any vitals not documented were not able to be obtained and vitals that have been documented are patient reported.    Persons Participating in Visit: Patient.  AWV Questionnaire: No: Patient Medicare AWV questionnaire was not completed prior to this visit.  Cardiac Risk Factors include: advanced age (>16men, >2 women);hypertension     Objective:    Today's Vitals   10/07/23 1517  Weight: 149 lb (67.6 kg)  Height: 5' 3.5 (1.613 m)   Body mass index is 25.98 kg/m.     10/07/2023    3:23 PM 02/12/2023   11:21 AM 09/18/2022    3:13 PM 06/06/2020    8:30 AM  Advanced Directives  Does Patient Have a Medical Advance Directive? Yes No Yes Yes  Type of Estate agent of [place];Living will  Healthcare Power of [place];Living will Healthcare Power of [place];Living will  Does patient want to make changes to medical advance directive?    No - Patient declined  Copy of Healthcare Power of Attorney in Chart? No - copy requested  No - copy requested No - copy requested    Current Medications (verified) Outpatient Encounter Medications as of  10/07/2023  Medication Sig   atorvastatin  (LIPITOR) 10 MG tablet TAKE 1 TABLET EVERY DAY   busPIRone  (BUSPAR ) 5 MG tablet TAKE 1 TABLET EVERY DAY AS NEEDED (Patient not taking: Reported on 08/20/2023)   calcium -vitamin D  (OSCAL WITH D) 500-5 MG-MCG tablet Take 2 tablets by mouth daily with breakfast.   cetirizine  (ZYRTEC ) 10 MG tablet Take 1 tablet (10 mg total) by mouth daily.   meclizine  (ANTIVERT ) 25 MG tablet Take 1 tablet (25 mg total) by mouth 3 (three) times daily as needed for dizziness.   omeprazole  (PRILOSEC) 20 MG capsule Take 1 capsule (20 mg total) by mouth daily.   No facility-administered encounter medications on file as of 10/07/2023.    Allergies (verified) Patient has no known allergies.   History: Past Medical History:  Diagnosis Date   Motion sickness    circular motion   Vertigo    none in over 1 year   Past Surgical History:  Procedure Laterality Date   COLONOSCOPY WITH PROPOFOL  N/A 06/06/2020   Procedure: COLONOSCOPY WITH PROPOFOL ;  Surgeon: Jinny Carmine, MD;  Location: [facility];  Service: Endoscopy;  Laterality: N/A;  priority 4   WISDOM TOOTH EXTRACTION     Family History  Problem Relation Age of Onset   Breast cancer Neg Hx    Social History   Socioeconomic History   Marital status: Married    Spouse name: Not on file   Number of children: 3   Years of education: Not on file   Highest education level: Not  on file  Occupational History   Occupation: Accounting  Tobacco Use   Smoking status: Never   Smokeless tobacco: Never  Vaping Use   Vaping status: Never Used  Substance and Sexual Activity   Alcohol use: Never   Drug use: Never   Sexual activity: Not on file  Other Topics Concern   Not on file  Social History Narrative   Not on file   Social Drivers of Health   Financial Resource Strain: Low Risk  (10/07/2023)   Overall Financial Resource Strain (CARDIA)    Difficulty of Paying Living Expenses: Not hard at all  Food  Insecurity: No Food Insecurity (10/07/2023)   Hunger Vital Sign    Worried About Running Out of Food in the Last Year: Never true    Ran Out of Food in the Last Year: Never true  Transportation Needs: No Transportation Needs (10/07/2023)   PRAPARE - Administrator, Civil Service (Medical): No    Lack of Transportation (Non-Medical): No  Physical Activity: Inactive (10/07/2023)   Exercise Vital Sign    Days of Exercise per Week: 0 days    Minutes of Exercise per Session: 0 min  Stress: No Stress Concern Present (10/07/2023)   [employer] of Occupational Health - Occupational Stress Questionnaire    Feeling of Stress: Not at all  Social Connections: Socially Integrated (10/07/2023)   Social Connection and Isolation Panel    Frequency of Communication with Friends and Family: More than three times a week    Frequency of Social Gatherings with Friends and Family: More than three times a week    Attends Religious Services: More than 4 times per year    Active Member of [employer] or Organizations: Yes    Attends Engineer, structural: More than 4 times per year    Marital Status: Married    Tobacco Counseling Counseling given: Not Answered    Clinical Intake:  Pre-visit preparation completed: Yes  Pain : No/denies pain     BMI - recorded: 25.98 Nutritional Status: BMI 25 -29 Overweight Nutritional Risks: None Diabetes: No  Lab Results  Component Value Date   HGBA1C 5.8 (H) 08/20/2023   HGBA1C 5.6 02/18/2023   HGBA1C 5.7 (H) 08/06/2022     How often do you need to have someone help you when you read instructions, pamphlets, or other written materials from your doctor or pharmacy?: 1 - Never  Interpreter Needed?: No  Information entered by :: Rojelio Blush LPN   Activities of Daily Living      10/07/2023    3:22 PM 11/20/2022   10:23 AM  In your present state of health, do you have any difficulty performing the following activities:  Hearing? 0 0   Vision? 0 0  Difficulty concentrating or making decisions? 0 0  Walking or climbing stairs? 0 0  Dressing or bathing? 0 0  Doing errands, shopping? 0 0  Preparing Food and eating ? N   Using the Toilet? N   In the past six months, have you accidently leaked urine? N   Do you have problems with loss of bowel control? N   Managing your Medications? N   Managing your Finances? N   Housekeeping or managing your Housekeeping? N     Patient Care Team: Antonette Angeline ORN, NP as PCP - General (Internal Medicine)   I have updated your Care Teams any recent Medical Services you may have received from other providers in the past  year.     Assessment:   This is a routine wellness examination for Rhonda Lamb.  Hearing/Vision screen Hearing Screening - Comments:: Denies hearing difficulties   Vision Screening - Comments::  - up to date with routine eye exams with  Dr Mevelyn   Goals Addressed               This Visit's Progress     Increase physical activity (pt-stated)        Remain active       Depression Screen      10/07/2023    3:21 PM 08/20/2023    8:22 AM 02/18/2023    8:46 AM 11/20/2022   10:23 AM 09/18/2022    3:15 PM 08/06/2022    8:46 AM 02/04/2022    9:44 AM  PHQ 2/9 Scores  PHQ - 2 Score 0 0 0 0 0 0 0  PHQ- 9 Score 0 1   0      Fall Risk      10/07/2023    3:22 PM 08/20/2023    8:22 AM 02/18/2023    8:46 AM 11/20/2022   10:23 AM 09/18/2022    3:14 PM  Fall Risk   Falls in the past year? 0 0 0 0 0  Number falls in past yr: 0    0  Injury with Fall? 0   0 0  Risk for fall due to : No Fall Risks No Fall Risks  No Fall Risks Medication side effect  Follow up Falls evaluation completed Falls evaluation completed   Falls prevention discussed;Falls evaluation completed    MEDICARE RISK AT HOME:   Medicare Risk at Home Any stairs in or around the home?: Yes If so, are there any without handrails?: No Home free of loose throw rugs in walkways, pet beds, electrical  cords, etc?: Yes Adequate lighting in your home to reduce risk of falls?: Yes Life alert?: No Use of a cane, walker or w/c?: No Grab bars in the bathroom?: Yes Shower chair or bench in shower?: Yes Elevated toilet seat or a handicapped toilet?: No  TIMED UP AND GO:  Was the test performed?  No  Cognitive Function: 6CIT completed        10/07/2023    3:23 PM 09/18/2022    3:16 PM 04/15/2021    5:07 PM 04/15/2021    4:00 PM  6CIT Screen  What Year? 0 points 0 points 0 points 0 points  What month? 0 points 0 points 0 points 0 points  What time? 0 points 0 points 0 points 0 points  Count back from 20 0 points 0 points 0 points 0 points  Months in reverse 0 points 0 points 0 points 0 points  Repeat phrase 0 points 0 points 0 points 0 points  Total Score 0 points 0 points 0 points 0 points    Immunizations Immunization History  Administered Date(s) Administered   Fluad Quad(high Dose 65+) 11/16/2019, 02/04/2022   Fluad Trivalent(High Dose 65+) 11/20/2022   H1N1 03/12/2008   Influenza Split 12/04/2015   Influenza, High Dose Seasonal PF 11/10/2014, 01/13/2017, 03/05/2018   Influenza,inj,Quad PF,6+ Mos 02/11/2011   PFIZER(Purple Top)SARS-COV-2 Vaccination 04/24/2019, 05/15/2019   PPD Test 10/20/2021   Pneumococcal Conjugate-13 08/21/2013   Pneumococcal Polysaccharide-23 01/06/2017   Tdap 02/11/2011    Screening Tests Health Maintenance  Topic Date Due   Zoster Vaccines- Shingrix (1 of 2) Never done   DTaP/Tdap/Td (2 - Td or Tdap) 02/10/2021  INFLUENZA VACCINE  10/01/2023   Medicare Annual Wellness (AWV)  10/06/2024   Colonoscopy  06/07/2030   Pneumococcal Vaccine: 50+ Years  Completed   DEXA SCAN  Completed   Hepatitis C Screening  Completed   Hepatitis B Vaccines  Aged Out   HPV VACCINES  Aged Out   Meningococcal B Vaccine  Aged Out   COVID-19 Vaccine  Discontinued    Health Maintenance  Health Maintenance Due  Topic Date Due   Zoster Vaccines- Shingrix (1 of  2) Never done   DTaP/Tdap/Td (2 - Td or Tdap) 02/10/2021   INFLUENZA VACCINE  10/01/2023   Health Maintenance Items Addressed:   Additional Screening:  Vision Screening: Recommended annual ophthalmology exams for early detection of glaucoma and other disorders of the eye. Would you like a referral to an eye doctor? No    Dental Screening: Recommended annual dental exams for proper oral hygiene  Community Resource Referral / Chronic Care Management: CRR required this visit?  No   CCM required this visit?  No   Plan:    I have personally reviewed and noted the following in the patient's chart:   Medical and social history Use of alcohol, tobacco or illicit drugs  Current medications and supplements including opioid prescriptions. Patient is not currently taking opioid prescriptions. Functional ability and status Nutritional status Physical activity Advanced directives List of other physicians Hospitalizations, surgeries, and ER visits in previous 12 months Vitals Screenings to include cognitive, depression, and falls Referrals and appointments  In addition, I have reviewed and discussed with patient certain preventive protocols, quality metrics, and best practice recommendations. A written personalized care plan for preventive services as well as general preventive health recommendations were provided to patient.   Rojelio LELON Blush, LPN   03/03/7972   After Visit Summary: (MyChart) Due to this being a telephonic visit, the after visit summary with patients personalized plan was offered to patient via MyChart   Notes: Nothing significant to report at this time.

## 2023-10-24 ENCOUNTER — Other Ambulatory Visit: Payer: Self-pay | Admitting: Internal Medicine

## 2023-10-24 DIAGNOSIS — E782 Mixed hyperlipidemia: Secondary | ICD-10-CM

## 2023-10-26 NOTE — Telephone Encounter (Signed)
 Requested Prescriptions  Pending Prescriptions Disp Refills   atorvastatin  (LIPITOR) 10 MG tablet [Pharmacy Med Name: ATORVASTATIN  CALCIUM  10 MG Oral Tablet] 90 tablet 3    Sig: TAKE 1 TABLET EVERY DAY     Cardiovascular:  Antilipid - Statins Failed - 10/26/2023 11:17 AM      Failed - Lipid Panel in normal range within the last 12 months    Cholesterol  Date Value Ref Range Status  08/20/2023 167 <200 mg/dL Final   LDL Cholesterol (Calc)  Date Value Ref Range Status  08/20/2023 76 mg/dL (calc) Final    Comment:    Reference range: <100 . Desirable range <100 mg/dL for primary prevention;   <70 mg/dL for patients with CHD or diabetic patients  with > or = 2 CHD risk factors. [name], [name] et al. [name]. 7986;689(80): 2061-2068  (http://education.[facility].com/faq/FAQ164)    HDL  Date Value Ref Range Status  08/20/2023 66 > OR = 50 mg/dL Final   Triglycerides  Date Value Ref Range Status  08/20/2023 156 (H) <150 mg/dL Final         Passed - Patient is not pregnant      Passed - Valid encounter within last 12 months    Recent Outpatient Visits           2 months ago Age-related osteoporosis without current pathological fracture   [facility] [place], Angeline ORN, [name]

## 2023-11-23 ENCOUNTER — Other Ambulatory Visit: Payer: Self-pay | Admitting: Internal Medicine

## 2023-11-24 NOTE — Telephone Encounter (Signed)
 Requested medication (s) are due for refill today: yes  Requested medication (s) are on the active medication list: yes  Last refill:  08/20/23  Future visit scheduled: yes  Notes to clinic:  Unable to refill per protocol, cannot delegate.      Requested Prescriptions  Pending Prescriptions Disp Refills   meclizine  (ANTIVERT ) 25 MG tablet [Pharmacy Med Name: MECLIZINE  HYDROCHLORIDE 25 MG Oral Tablet] 30 tablet 1    Sig: TAKE 1 TABLET THREE TIMES DAILY AS NEEDED FOR DIZZINESS     Not Delegated - Gastroenterology: Antiemetics Failed - 11/24/2023 12:07 PM      Failed - This refill cannot be delegated      Passed - Valid encounter within last 6 months    Recent Outpatient Visits           3 months ago Age-related osteoporosis without current pathological fracture   [facility] [place], Angeline ORN, [name]

## 2024-01-06 NOTE — Discharge Summary (Signed)
 [facility] Discharge Summary  Name: Rhonda Lamb MRN: [id number] DOB: [birth year]   Age: 78 y.o. Gender: female    Room/Bed: Rehab 3C06/3C0601 Admit: 12/31/2023  2:38 PM   PCP: Angeline Laura, NP in Geriatrics [facility] Attending Provider: Dr.Rao-patel                                  Consulting physicians:  - Hospitalist Discharge date: 01/07/24 Discharge Diagnosis: Acetabular labrum tear  History of Present Illness: 78 year old female with prior history of hyperlipidemia and GERD who presented to [facility] ED on 12/30/2023 after a fall. She was visiting her brother who is currently a patient at [facility] when the fall happened. She had issues when trying to get out of her reclining chair resulting in fall.  Denied any loss of consciousness or head trauma associated with this.  She fell and landed directly on her outer right hip. Had significant pain most notably in the right thigh and groin and was unable to bear weight. Triage note reported episode of dizziness and nausea after falling; she stated this happened when they first tried to get her up and she had severe pain in her hip, so suspect this was a vasovagal episode. X-ray right hip showed no acute fracture or dislocation. CT of hip showed subcutaneous stranding about the lateral right hip margin, compatible with contusion. She did have pain relief when ED MD internally rotated her right hip. EKG NSR, no ischemic ST deviation, normal axis, no priors available for comparison. Calcium   8.3 and plts 141. Able to minimally bear weight but unable to walk due to right hip pain. No bony abnormalities identified on MRI but patient was noted to have moderate bilateral hamstring origin tendinosis with partial tears of bilateral hamstring origins.  Additionally noted to have complex tear of the right anterior superior and superior acetabular labrum with intermediate grade juxta labral chondrosis. She had severe pain with  right hip when attempting to bear weight and utilized  Norco, lidocaine  patch, as well as, muscle relaxants with better control. Her BP was elevated over course of hospital stay.  Suspect secondary to pain elevation.  Reluctant to add scheduled medications at present as suspect blood pressure will significantly improved with adequate pain control and wish to avoid hypotensive episodes particularly in setting of recent fall.  She was given oral hydralazine as needed. Utilized as needed oral hydralazine and monitored. She worked with PT/OT while hospitalized and IRF was recommended at discharge. Pt is medically stable to participate in IRF level of care. This patient requires specialized interventions and medical oversight in an accredited inpatient rehab program to maintain medical stability during the resumption and progression of activity. These specialized interventions and intense medical management are not available in any other level of post-acute care. Rehab physician and rehab nursing will be essential to monitor and manage acute and chronic risks and co morbidities in order to prevent complications that could lead to further impairment and debility. Conditions and risks that will require monitoring and management in a rehab hospital level of care include fall prevention and recovery, pain, and vital sign monitoring and management. Medical and specialty providers will be consulted as appropriate to manage patient's complex needs. Rehab nursing will reinforce skills learned in th erapy, support the patient's goals, provide patient and family education and training, schedule and perform structured nursing activities and procedures to help the patient  reach and maintain maximum function. These specialized services performed by the rehab physician and rehab nurse are not available in any other level of post-acute care.        Ancillary Data:  Diagnostic Results     ** No results found for the last 24  hours. **        Lab Results  Component Value Date   WBC 4.9 01/01/2024   HGB 14.1 01/01/2024   HCT 42 01/01/2024   MCV 90 01/01/2024   PLT 148 (L) 01/01/2024   Lab Results  Component Value Date   CREAT 1.11 01/01/2024   BUN 17 01/01/2024   NA 139 01/01/2024   K 3.6 01/01/2024   CL 104 01/01/2024   CO2 26 01/01/2024     Rehabilitation Course: The patient was admitted to [facility] for a comprehensive inpatient rehabilitation program focused at a higher level of functioning with regards to bed mobility, gait, transfers, and activities of daily living.  An interdisciplinary team conference was held on a weekly basis to discuss disposition and prognosis.  At the time of discharge, the patient is requiring the following: Mobility: Bed mobility: indep, Transfers: stand-step with RW and modI vs SBA no device; Gait: 120 ft with RW indep; Stairs: 8 stairs SBA;  W/C mobility: dependent (not a therapy focus) Activities of Daily Living: Pt is independent for all ADLs with use of RW Cognition: WFL  PROBLEM LIST During the patient's rehabilitation stay, we continued to monitor the following problems the following problems: Patient Active Problem List   Diagnosis Date Noted  . Primary hypertension 12/31/2023  . Hyperlipidemia 12/31/2023  . Acetabular labrum tear 12/31/2023  . Fall 12/30/2023    Current problem list and plan:  # ADL/gait deficits - Completed AIR   # Fall resulting in R acetabular labrum tear/bilateral hamstring tear  - WBAT - Pain: Tylenol 500mg  q8h, ibuprofen 600mg  TID, LDP, Robaxin 500mg  q8h, Norco prn (not using often- will provide small prescription at discharge). Add gabapentin 100mg  qHS for neuropathic pain.  - DVT ppx: Lovenox while in rehab  - Follow up with PCP for ongoing pain management/weaning   # Elevated blood pressure Noted on acute, felt to be related to pain - Started on Losartan given SBP 200s despite adequate pain control -  Continue monitoring, titrate as needed - Will need close follow up with PCP. Recommend checking BP daily, alternating day/night.    # GERD - PPI    # Thrombocytyopenia  - Hep C screen checked on acute (negative) - Covid/flu negative  - Continue to monitor    # Gi ppx - Bowel regimen (Ducolax, Miralax, Senna, Suppository as needed)   # Allergies - Zyrtec  daily   # Vitamin D  deficiency, level 21 10/30 - 50K u weekly on Thursdays   Labs: Reviewed 11/1  The patient has now maximized his/her potential in inpatient rehab and will be discharged home  Medication Intervention: Did the facility contact and complete physician (or physician-designee) prescribed recommended actions by midnight of the next calendar day each time potential clinically significant medication issues were identified?: Yes  Discharge Medications:    Discharge Medications     .    acetaminophen 500 MG tablet Take 1 tablet (500 mg total) by mouth every 8 (eight) hours. Commonly known as: TYLENOL   atorvastatin  40 MG tablet Take 1 tablet  by mouth nightly. Commonly known as: LIPITOR   cetirizine  10 MG tablet Take 1 tablet (10 mg total) by  mouth daily. Commonly known as: ZyrTEC    ergocalciferol (vitamin D2) 1,250 mcg (50,000 unit) capsule Take 1 capsule by mouth once a week. Commonly known as: DRISDOL  Start taking on: January 13, 2024   gabapentin 100 MG capsule Take 1 capsule by mouth nightly. Commonly known as: NEURONTIN   HYDROcodone -acetaminophen 7.5-325 mg per tablet Take 1 tablet by mouth every 4 (four) hours as needed for moderate pain or severe pain for up to 5 days. Commonly known as: NORCO   ibuprofen 600 MG tablet Take 1 tablet  by mouth 3 (three) times a day. Commonly known as: MOTRIN   lidocaine  4 % Place 3 patches on the skin daily. Commonly known as: SALONPAS   losartan 50 MG tablet Take 1 tablet by mouth nightly. Commonly known as: COZAAR   methocarbamoL 500 MG  tablet Take 1 tablet by mouth every 8 (eight) hours. Commonly known as: ROBAXIN   pantoprazole 40 MG tablet Take 1 tablet  by mouth daily. Commonly known as: PROTONIX   polyethylene glycol 17 gram packet Take 17 g by mouth daily. Commonly known as: MIRALAX   senna-docusate 8.6-50 mg Take 2 tablets by mouth 2 (two) times a day. Commonly known as: SENOKOT S        High Risk Med Review:  High Risk Medications Review Assessment period: Discharge Assessment High Risk Drug Classes: Anticoagulants, Opioids Anticoagulants: enoxaparin (Lovenox) enoxaparin (Lovenox) indication: DVT ppx Opioids: hydrocodone /acetaminophen (Norco) for pain   Discharge Plan: Discharge Disposition     Discharge Orders     Discharge Patient: Expected discharge date: 01/07/2024 Expected discharge time: Morning  Disposition: Home or Self Care Destination: --             Diet Instructions     Regular diet        Activity Instructions     Activity Activity: As tolerated     Activity: As tolerated      Physician Follow ups     PROVIDER NOT IN SYSTEM, MD  Relationship: PCP - General   [place]     Next Steps: Follow up   Instructions: Follow up with your primary care provider in 2-4 weeks for chronic conditions and for ongoing blood pressure management   Follow up: 2 weeks   Further Instructions: Follow up with your primary care provider in 2-4 weeks for chronic conditions and for ongoing blood pressure management      Home Health and DME     DME   As directed    Provider who completed face to face evaluation: Dr. Belvie PARAS. Franklin, MD   Date of face to face evaluation: 01/06/2024   Type of DME: Ambulatory Aide   Ambulatory Aids: Vannie Vannie Length of Need: 7m/Lifetime   Walker:  Folding Vannie Vannie w/ wheels     Walker Size: Adult  Additional information on Home Health and DME:    The following outpatient therapies have been recommended: PT.  Your outpatient therapy  provider is [facility] and their phone number is [phone].  Please contact your outpatient therapy provider to schedule your first appointments.    The following equipment has been ordered for you through [facility], an [employer]: Rolling walker If you have questions about your equipment, call [employer] [facility] [employer] at [phone], option 8.      Other Instructions     Discharge instructions     If you have a blood pressure cuff at home, please check your blood  pressure alternating once daily in the morning and the next day at bedtime and bring these readings to your primary care provider. This will help them better manage your blood pressure and make adjustments to Losartan as indicated.   You may start weaning your pain medication as tolerated over the next few weeks. First medication to stop taking is Norco (Hydrocodone -acetaminophen). If your pain is controlled without this medication, consider only taking robaxin (muscle relaxant) as needed instead of scheduled three times a day. Your PCP can assist you with this.         Discharge Physician: Cyrus Deter, MD  Discharge summary completed by: Powell Macario Adu  I certify that it took greater than 35 minutes to complete the discharge summary and review medications.

## 2024-01-07 NOTE — Discharge Summary (Signed)
 [facility] Case Management Discharge Summary   Name: Rhonda Lamb   MRN: [id number]  Account#: [id number]    Room/Bed: Rehab 3C06/3C0601   Demographics:  DOB: 1945-05-26  Age: 78 y.o.            Gender: female  Past Medical History: Past Medical History[1]   Date of Onset: 12/30/23    Discharge Date: 01/07/24   Individual Identified to Coordinate Follow Up Care: Patient and husband     Patient's Discharge Destination/Living Setting: Home (private home/apt., board/care, assisted living, group home, transitional living, other residential care arrangements) Discharge Disposition Details:  Home with husband and family support       Patient will be living: Family/Relatives  Discharge Supports:  Patient has supportive family with follow up therapy OP (PT,OT)   Did patient receive DME: Yes Patient/Caregiver was provided a list and given the option to choose a DME provider Patient/Caregiver had: No Preference     Understanding of Financial Impact:Patient/Family understand anticipated financial impact of hospitalization and follow up services.  Insurance/Financial Comments:  No concerns noted at this time  Community Resources/Referrals Provided: rehab education folder, handicapped placard, discharge planning  Health Literacy:  How often do you need to have someone help you when you read instructions, pamphlets, or other written material from your doctor or pharmacy?: Never  Transportation Concerns: No  How often do you feel lonely or isolated from those around you?: Never  Family Training:family training completed throughout stay   IRF-PAI Assessment Patient Mood Interview (PHQ):  Total Severity Score (for IRF-PAI): 0 Comments: No concerns reported at this time  Symptom presence - Little interest or pleasure in doing things: No Not at all  Symptom presence - Feeling down, depressed, or hopeless: No Not at all  Status of Goals at Discharge: All Goals  Met    Electronically signed by: JAYCIE C FRYE, LCSWA  on Date: 01/07/2024  at Time: 11:18 AM        [1] Past Medical History: Diagnosis Date  . GERD (gastroesophageal reflux disease)   . HLD (hyperlipidemia)   . Hypertension

## 2024-01-10 ENCOUNTER — Encounter: Payer: Self-pay | Admitting: Internal Medicine

## 2024-01-10 ENCOUNTER — Ambulatory Visit: Admitting: Internal Medicine

## 2024-01-10 VITALS — BP 160/96 | Ht 63.5 in | Wt 149.0 lb

## 2024-01-10 DIAGNOSIS — S76311A Strain of muscle, fascia and tendon of the posterior muscle group at thigh level, right thigh, initial encounter: Secondary | ICD-10-CM

## 2024-01-10 DIAGNOSIS — I158 Other secondary hypertension: Secondary | ICD-10-CM

## 2024-01-10 DIAGNOSIS — S76312A Strain of muscle, fascia and tendon of the posterior muscle group at thigh level, left thigh, initial encounter: Secondary | ICD-10-CM | POA: Diagnosis not present

## 2024-01-10 DIAGNOSIS — S73191D Other sprain of right hip, subsequent encounter: Secondary | ICD-10-CM

## 2024-01-10 MED ORDER — LOSARTAN POTASSIUM 100 MG PO TABS: 100.0000 mg | ORAL_TABLET | Freq: Every day | ORAL | 1 refills | Status: AC

## 2024-01-10 NOTE — Patient Instructions (Signed)
Hip Exercises Ask your health care provider which exercises are safe for you. Do exercises exactly as told by your provider and adjust them as told. It is normal to feel mild stretching, pulling, tightness, or discomfort as you do these exercises. Stop right away if you feel sudden pain or your pain gets worse. Do not begin these exercises until told by your provider. Stretching and range-of-motion exercises These exercises warm up your muscles and joints and improve the movement and flexibility of your hip. They also help to relieve pain, numbness, and tingling. You may be asked to limit your range of motion if you had a hip replacement. Talk to your provider about these limits. Hamstrings, supine  Lie on your back (supine position). Loop a belt, towel, or exercise band over the ball of your left / right foot. The ball of your foot is on the walking surface, right under your toes. Straighten your left / right knee and slowly pull on the belt, towel, or band to raise your leg until you feel a gentle stretch behind your knee (hamstring). Do not let your knee bend while you do this. Keep your other leg flat on the floor. Hold this position for __________ seconds. Slowly return your leg to the starting position. Repeat __________ times. Complete this exercise __________ times a day. Hip rotation  Lie on your back on a firm surface. With your left / right hand, gently pull your left / right knee toward the shoulder that is on the same side of the body. Stop when your knee is pointing toward the ceiling. Hold your left / right ankle with your other hand. Keeping your knee steady, gently pull your left / right ankle toward your other shoulder until you feel a stretch in your butt. Keep your hips and shoulders firmly planted while you do this stretch. Hold this position for __________ seconds. Repeat __________ times. Complete this exercise __________ times a day. Seated stretch This exercise is  sometimes called hamstrings and adductors stretch. Sit on the floor with your legs stretched wide. Keep your knees straight during this exercise. Keeping your head and back in a straight line, bend at your waist to reach for your left foot (position A). You should feel a stretch in your right inner thigh (adductors). Hold this position for __________ seconds. Then slowly return to the upright position. Keeping your head and back in a straight line, bend at your waist to reach forward (position B). You should feel a stretch behind both of your thighs and knees (hamstrings). Hold this position for __________ seconds. Then slowly return to the upright position. Keeping your head and back in a straight line, bend at your waist to reach for your right foot (position C). You should feel a stretch in your left inner thigh (adductors). Hold this position for __________ seconds. Then slowly return to the upright position. Repeat __________ times. Complete this exercise __________ times a day. Lunge This exercise stretches the muscles of the hip (hip flexors). Place your left / right knee on the floor and bend your other knee so that is directly over your ankle. You should be half-kneeling. Keep good posture with your head over your shoulders. Tighten your butt muscles to point your tailbone downward. This will prevent your back from arching too much. You should feel a gentle stretch in the front of your left / right thigh and hip. If you do not feel a stretch, slide your other foot forward slightly and  then slowly lunge forward with your chest up until your knee once again lines up over your ankle. Make sure your tailbone continues to point downward. Hold this position for __________ seconds. Slowly return to the starting position. Repeat __________ times. Complete this exercise __________ times a day. Strengthening exercises These exercises build strength and endurance in your hip. Endurance is the  ability to use your muscles for a long time, even after they get tired. Bridge This exercise strengthens the muscles of your hip (hip extensors). Lie on your back on a firm surface with your knees bent and your feet flat on the floor. Tighten your butt muscles and lift your bottom off the floor until the trunk of your body and your hips are level with your thighs. Do not arch your back. You should feel the muscles working in your butt and the back of your thighs. If you do not feel these muscles, slide your feet 1-2 inches (2.5-5 cm) farther away from your butt. Hold this position for __________ seconds. Slowly lower your hips to the starting position. Let your muscles relax completely between repetitions. Repeat __________ times. Complete this exercise __________ times a day. Straight leg raises, side-lying This exercise strengthens the muscles that move the hip joint away from the center of the body (hip abductors). Lie on your side with your left / right leg in the top position. Lie so your head, shoulder, hip, and knee line up. You may bend your bottom knee slightly to help you balance. Roll your hips slightly forward, so your hips are stacked directly over each other and your left / right knee is facing forward. Leading with your heel, lift your top leg 4-6 inches (10-15 cm). You should feel the muscles in your top hip lifting. Do not let your foot drift forward. Do not let your knee roll toward the ceiling. Hold this position for __________ seconds. Slowly return to the starting position. Let your muscles relax completely between repetitions. Repeat __________ times. Complete this exercise __________ times a day. Straight leg raises, side-lying This exercise strengthens the muscles that move the hip joint toward the center of the body (hip adductors). Lie on your side with your left / right leg in the bottom position. Lie so your head, shoulder, hip, and knee line up. You may place your  upper foot in front to help you balance. Roll your hips slightly forward, so your hips are stacked directly over each other and your left / right knee is facing forward. Tense the muscles in your inner thigh and lift your bottom leg 4-6 inches (10-15 cm). Hold this position for __________ seconds. Slowly return to the starting position. Let your muscles relax completely between repetitions. Repeat __________ times. Complete this exercise __________ times a day. Straight leg raises, supine This exercise strengthens the muscles in the front of your thigh (quadriceps and hip flexors). Lie on your back (supine position) with your left / right leg extended and your other knee bent. Tense the muscles in the front of your left / right thigh. You should see your kneecap slide up or see increased dimpling just above your knee. Keep these muscles tight as you raise your leg 4-6 inches (10-15 cm) off the floor. Do not let your knee bend. Hold this position for __________ seconds. Keep these muscles tense as you lower your leg. Relax the muscles slowly and completely between repetitions. Repeat __________ times. Complete this exercise __________ times a day. Hip abductors, standing This  exercise strengthens the muscles that move the leg and hip joint away from the center of the body (hip abductors). Tie one end of a rubber exercise band or tubing to a secure surface, such as a chair, table, or pole. Loop the other end of the band or tubing around your left / right ankle. Keeping your ankle with the band or tubing directly opposite the secured end, step away until there is tension in the tubing or band. Hold on to a chair, table, or pole as needed for balance. Lift your left / right leg out to your side. While you do this: Keep your back upright. Keep your shoulders over your hips. Keep your toes pointing forward. Make sure to use your hip muscles to slowly lift your leg. Do not tip your body or  forcefully lift your leg. Hold this position for __________ seconds. Slowly return to the starting position. Repeat __________ times. Complete this exercise __________ times a day. Squats This exercise strengthens the muscles in the front of your thigh (quadriceps). Stand in front of a table, or stand in a doorframe so your feet and knees are in line with the frame. You may place your hands on the table or frame for balance. Slowly bend your knees and lower your hips like you are going to sit in a chair. Keep your lower legs in a straight up-and-down position. Do not let your hips go lower than your knees. Do not bend your knees lower than told by your provider. If your hip pain increases, do not bend as low. Hold this position for ___________ seconds. Slowly push with your legs to return to standing. Do not use your hands to pull yourself to standing. Repeat __________ times. Complete this exercise __________ times a day. This information is not intended to replace advice given to you by your health care provider. Make sure you discuss any questions you have with your health care provider. Document Revised: 10/21/2021 Document Reviewed: 10/21/2021 Elsevier Patient Education  2024 [employer].

## 2024-01-10 NOTE — Progress Notes (Signed)
 Subjective:    Patient ID: Slater Aretta Leech, female    DOB: [birth year], 78 y.o.   MRN: [id number]  HPI  Discussed the use of AI scribe software for clinical note transcription with the patient, who gave verbal consent to proceed.  Brandyce Dimario is a 78 year old female who presents with pain and mobility issues following a fall. She is accompanied by her daughter, Allena.  She fell while visiting her brother in the hospital, attempting to get up from a recliner chair to use the bathroom at 2 AM. Unable to retract the leg rest, she fell and experienced intense pain immediately after. She immediately felt pain, and had a vagal episode when attempting to stand. Ct right hip showed:  IMPRESSION:   1. No acute fracture.  2. Soft tissue contusion in the subcutaneous fat of the right lateral proximal thigh.  3. Moderate bilateral hamstring origin tendinosis with partial tears of the bilateral hamstring tendon origins, high-grade partial on the left.  4. Mild bilateral gluteal tendinosis.  5. Complex tear of the right anterior superior and superior acetabular labrum with intermediate grade juxta labral chondrosis and subchondral cystic changes in the acetabulum. No substantial right hip joint effusion.  6. Moderate left SI joint degenerative changes with mild subchondral marrow edema.  XR Femur Right Portable IMPRESSION: No evidence for acute fracture nor dislocation involving right hip nor right femur.  XR Hip Right IMPRESSION: No evidence for acute fracture nor dislocation involving right hip nor right femur.  CT Extremity Lower Right Without IV Contrast IMPRESSION: Subcutaneous contusion at lateral right hip margin without evidence for acute fracture nor dislocation.   She was admitted for pain control and inpatient rehab.  Following the fall, she experienced excruciating pain rated as ten out of ten, initially preventing her from putting any pressure on her leg. Within two days,  she was able to put full pressure on the leg without pain. Currently, she experiences pain in the top part of her leg, specifically in the groin area, when transitioning from sitting to standing and sometimes while walking, described as a 'catch' or 'hurting'.  During her hospital stay, she was treated with hydralazine for hypertension and transitioned to losartan 50 mg, which she takes at night. She has been monitoring her blood pressure at home, with recent readings showing elevated levels, including 181/86 and 177/76.  For pain management, she is taking ibuprofen three times a day and hydrocodone  at night. She attempted to discontinue hydrocodone  but experienced pain, necessitating its continued use. She is not currently using methocarbamol or pain patches.       Review of Systems     Past Medical History:  Diagnosis Date   Motion sickness    circular motion   Vertigo    none in over 1 year    Current Outpatient Medications  Medication Sig Dispense Refill   atorvastatin  (LIPITOR) 10 MG tablet TAKE 1 TABLET EVERY DAY 90 tablet 3   busPIRone  (BUSPAR ) 5 MG tablet TAKE 1 TABLET EVERY DAY AS NEEDED (Patient not taking: Reported on 08/20/2023) 90 tablet 0   calcium -vitamin D  (OSCAL WITH D) 500-5 MG-MCG tablet Take 2 tablets by mouth daily with breakfast. 180 tablet 1   cetirizine  (ZYRTEC ) 10 MG tablet Take 1 tablet (10 mg total) by mouth daily. 90 tablet 1   meclizine  (ANTIVERT ) 25 MG tablet TAKE 1 TABLET THREE TIMES DAILY AS NEEDED FOR DIZZINESS 30 tablet 1   omeprazole  (PRILOSEC) 20 MG capsule  Take 1 capsule (20 mg total) by mouth daily. 90 capsule 1   No current facility-administered medications for this visit.    No Known Allergies  Family History  Problem Relation Age of Onset   Breast cancer Neg Hx     Social History   Socioeconomic History   Marital status: Married    Spouse name: Not on file   Number of children: 3   Years of education: Not on file   Highest education  level: Not on file  Occupational History   Occupation: Accounting  Tobacco Use   Smoking status: Never   Smokeless tobacco: Never  Vaping Use   Vaping status: Never Used  Substance and Sexual Activity   Alcohol use: Never   Drug use: Never   Sexual activity: Not on file  Other Topics Concern   Not on file  Social History Narrative   Not on file   Social Drivers of Health   Financial Resource Strain: Low Risk  (10/07/2023)   Overall Financial Resource Strain (CARDIA)    Difficulty of Paying Living Expenses: Not hard at all  Food Insecurity: Low Risk (12/31/2023)   Received from [facility]   Food Insecurity    Within the past 12 months, did the food you bought just not last and you didn't have money to get more?: No    Within the past 12 months, did you worry that your food would run out before you got money to buy more?: No  Transportation Needs: Low Risk (12/31/2023)   Received from [facility]   Transportation Needs    Within the past 12 months, has a lack of transportation kept you from medical appointments or from doing things needed for daily living?: No  Physical Activity: Inactive (10/07/2023)   Exercise Vital Sign    Days of Exercise per Week: 0 days    Minutes of Exercise per Session: 0 min  Stress: No Stress Concern Present (10/07/2023)   [employer] of Occupational Health - Occupational Stress Questionnaire    Feeling of Stress: Not at all  Social Connections: Socially Integrated (10/07/2023)   Social Connection and Isolation Panel    Frequency of Communication with Friends and Family: More than three times a week    Frequency of Social Gatherings with Friends and Family: More than three times a week    Attends Religious Services: More than 4 times per year    Active Member of [employer] or Organizations: Yes    Attends Banker Meetings: More than 4 times per year    Marital Status: Married  Catering Manager Violence: Not At  Risk (10/07/2023)   Humiliation, Afraid, Rape, and Kick questionnaire    Fear of Current or Ex-Partner: No    Emotionally Abused: No    Physically Abused: No    Sexually Abused: No     Constitutional: Pt reports intermittent headaches. Denies fever, malaise, fatigue, headache or abrupt weight changes.  Respiratory: Patient reports intermittent cough.  Denies difficulty breathing, shortness of breath, or sputum production.   Cardiovascular: Denies chest pain, chest tightness, palpitations or swelling in the hands or feet.  Gastrointestinal: Denies abdominal pain, bloating, constipation, diarrhea or blood in the stool.  GU: Denies urgency, frequency, pain with urination, burning sensation, blood in urine, odor or discharge. Musculoskeletal: Patient reports right groin pain.  Denies decrease in range of motion, difficulty with gait, muscle pain or joint swelling.  Skin: Denies redness, rashes, lesions or  ulcercations.  Neurological:   Denies difficulty with memory, difficulty with speech or problems with balance and coordination.    No other specific complaints in a complete review of systems (except as listed in HPI above).  Objective:   Physical Exam  BP (!) 160/96   Ht 5' 3.5 (1.613 m)   Wt 149 lb (67.6 kg)   BMI 25.98 kg/m      Wt Readings from Last 3 Encounters:  10/07/23 149 lb (67.6 kg)  08/20/23 154 lb 6.4 oz (70 kg)  04/01/23 154 lb 9.6 oz (70.1 kg)    General: Appears her stated age, overweight, in NAD. Cardiovascular: Normal rate and rhythm.  Pulmonary/Chest: Normal effort and positive vesicular breath sounds. No respiratory distress. No wheezes, rales or ronchi noted.  Musculoskeletal: Normal flexion, extension and rotation of the spine. Normal abduction, adduction, internal and external rotation of the right hip. She reports pain with palpation in the right groin. Strength 5/5 BLE. No difficulty with gait.  Neurological: Alert and oriented.   BMET    Component  Value Date/Time   NA 139 08/20/2023 0824   K 4.2 08/20/2023 0824   CL 105 08/20/2023 0824   CO2 26 08/20/2023 0824   GLUCOSE 94 08/20/2023 0824   BUN 12 08/20/2023 0824   CREATININE 0.83 08/20/2023 0824   CALCIUM  8.9 08/20/2023 0824   GFRNONAA 72 11/16/2019 1058   GFRAA 83 11/16/2019 1058    Lipid Panel     Component Value Date/Time   CHOL 167 08/20/2023 0824   TRIG 156 (H) 08/20/2023 0824   HDL 66 08/20/2023 0824   CHOLHDL 2.5 08/20/2023 0824   LDLCALC 76 08/20/2023 0824    CBC    Component Value Date/Time   WBC 4.7 08/20/2023 0824   RBC 5.03 08/20/2023 0824   HGB 15.0 08/20/2023 0824   HCT 46.8 (H) 08/20/2023 0824   PLT 187 08/20/2023 0824   MCV 93.0 08/20/2023 0824   MCH 29.8 08/20/2023 0824   MCHC 32.1 08/20/2023 0824   RDW 12.8 08/20/2023 0824   LYMPHSABS 1,946 05/21/2020 0913   EOSABS 108 05/21/2020 0913   BASOSABS 38 05/21/2020 0913    Hgb A1C Lab Results  Component Value Date   HGBA1C 5.8 (H) 08/20/2023          Assessment & Plan:   Assessment and Plan    Hospital Followup for Right hip labral tear and bilateral hamstring strain Hospital notes, labs and imaging reviewed. Right hip labral tear and bilateral hamstring strain post-fall. Pain improved but persists in groin area during movement. Managed with ibuprofen and hydrocodone . - Continue ibuprofen three times daily. Try to wean if possible as this can cause elevated blood pressures - Continue hydrocodone  at night as needed. Try to wean if possible - Referred to physical therapy in [facility] for further evaluation and treatment.  Secondary hypertension Hypertension with elevated home readings despite losartan 50 mg. Increased dosage required to achieve target 130/80 mmHg. - Increased losartan to 100 mg daily. - Monitor blood pressure at home and report readings. - Scheduled follow-up next month to recheck blood pressure.        RTC in 1 months, follow-up chronic conditions Angeline Laura,  NP

## 2024-01-14 ENCOUNTER — Ambulatory Visit: Attending: Physical Medicine and Rehabilitation

## 2024-01-14 DIAGNOSIS — R29898 Other symptoms and signs involving the musculoskeletal system: Secondary | ICD-10-CM | POA: Diagnosis present

## 2024-01-14 DIAGNOSIS — M25551 Pain in right hip: Secondary | ICD-10-CM | POA: Insufficient documentation

## 2024-01-14 DIAGNOSIS — S73191D Other sprain of right hip, subsequent encounter: Secondary | ICD-10-CM | POA: Diagnosis present

## 2024-01-14 NOTE — Therapy (Addendum)
 OUTPATIENT PHYSICAL THERAPY LOWER EXTREMITY EVALUATION   Patient Name: Rhonda Lamb MRN: [id number] DOB:01-Feb-1946, 78 y.o., female Today's Date: 01/14/2024  END OF SESSION:  PT End of Session - 01/14/24 1232     Visit Number 1    Number of Visits 17    Date for Recertification  03/10/24    Authorization Type 2x/week x 8 weeks    PT Start Time 1030    PT Stop Time 1115    PT Time Calculation (min) 45 min          Past Medical History:  Diagnosis Date   Motion sickness    circular motion   Vertigo    none in over 1 year   Past Surgical History:  Procedure Laterality Date   COLONOSCOPY WITH PROPOFOL  N/A 06/06/2020   Procedure: COLONOSCOPY WITH PROPOFOL ;  Surgeon: Jinny Carmine, MD;  Location: [facility];  Service: Endoscopy;  Laterality: N/A;  priority 4   WISDOM TOOTH EXTRACTION     Patient Active Problem List   Diagnosis Date Noted   Osteoarthritis 08/20/2023   Vertigo 08/20/2023   Chronic cough 02/18/2023   Prediabetes 11/20/2022   GAD (generalized anxiety disorder) 02/04/2022   Overweight with body mass index (BMI) of 26 to 26.9 in adult 09/24/2020   Essential hypertension 06/18/2020   Osteoporosis 12/07/2019   Mixed hyperlipidemia 11/16/2019    PCP: Angeline Laura, NP  REFERRING PROVIDER: Belvie Roers, MD  REFERRING DIAG:  Diagnosis  S73.191D (ICD-10-CM) - Tear of right acetabular labrum, subsequent encounter  D23.687J (ICD-10-CM) - Partial tear of left hamstring  S76.311A (ICD-10-CM) - Partial tear of right hamstring    THERAPY DIAG:  Pain in right hip  Weakness of right hip  Tear of right acetabular labrum, subsequent encounter  Rationale for Evaluation and Treatment: Rehabilitation  ONSET DATE: 15 days ago  SUBJECTIVE:   SUBJECTIVE STATEMENT: Pt is here with her husband Rhonda Lamb.  MOI: 12/30/23 fall on R outer hip when trying to get out of a hospital recliner bed while visiting her brother at [facility]  Underwent full work up in  [facility].  And was admitted to hospital for tx.  No fx.  Sustained R labral tear (anterior/superior) and hamstring strain/partial tear.  C/o R achy in glute max area, R goin area and runs down into inner thigh  Sx come and go in R hip/groin  Not back to her normal activities  Now able to put weight on R LE- was not initially  CLOF: getting in and out of the shower is concerning, not doing housework, not driving, has everything on first floor she needs- has a second floor of her home- able to do if she needs to   PLOF: was driving, was independent, spending time with family, [place] she [place] to, getting ready for holidays, gardening/landscaping/weeding, helping her brother- does a lot of physical activities to help him   PERTINENT HISTORY: Has concerns of high BP- trying to manage this since the injury too (medication side effect)   Overall, making progress.   PAIN:  Are you having pain? 2/10  PRECAUTIONS: Fall  RED FLAGS: None   WEIGHT BEARING RESTRICTIONS: No  FALLS:  Has patient fallen in last 6 months? Yes. Number of falls 1  LIVING ENVIRONMENT: Lives with: lives with their spouse Lives in: House/apartment Stairs: yes, few to enter and second story Has following equipment at home: Single point cane and Walker - 2 wheeled but not using  OCCUPATION: not working  PLOF: Independent  PATIENT GOALS: to be able to manage the pain and build up the strength again in her hip to resume her PLOF   NEXT MD VISIT: unknown  OBJECTIVE:  Note: Objective measures were completed at Evaluation unless otherwise noted.  DIAGNOSTIC FINDINGS: full work up in [facility] after injury MRI 12/30/23 IMPRESSION:  1.  No acute fracture.  2.  Soft tissue contusion in the subcutaneous fat of the right lateral proximal thigh.  3.  Moderate bilateral hamstring origin tendinosis with partial tears of the bilateral hamstring tendon origins, high-grade partial on the left.  4.  Mild bilateral  gluteal tendinosis.  5.  Complex tear of the right anterior superior and superior acetabular labrum with intermediate grade juxta labral chondrosis and subchondral cystic changes in the acetabulum. No substantial right hip joint effusion.  6.  Moderate left SI joint degenerative changes with mild subchondral marrow edema.   PATIENT SURVEYS:  LEFS: to be collected  COGNITION: Overall cognitive status: Within functional limits for tasks assessed     SENSATION: WFL   POSTURE:  Hamstring flexibility with SLR: R 65, R 85 deg  PALPATION:  TTP    LOWER EXTREMITY ROM:  Active ROM Right eval Left eval  Hip flexion 105 pain 110  Hip extension    Hip abduction 25 40  Hip adduction    Hip internal rotation 20 20  Hip external rotation 20 30  Knee flexion    Knee extension    Ankle dorsiflexion    Ankle plantarflexion    Ankle inversion    Ankle eversion     (Blank rows = not tested)  LOWER EXTREMITY MMT:  MMT Right eval Left eval  Hip flexion 3+/5 pain 5  Hip extension    Hip abduction 3+/5 pain 4  Hip adduction    Hip internal rotation 4 5  Hip external rotation 4 5  Knee flexion 4 4  Knee extension    Ankle dorsiflexion    Ankle plantarflexion    Ankle inversion    Ankle eversion     (Blank rows = not tested)  LOWER EXTREMITY SPECIAL TESTS:    FUNCTIONAL TESTS:  5 times sit to stand: 15 sec  GAIT: Distance walked: into clinic without AD, + antalgic gait on R noted                                                                                                                                TREATMENT DATE: 01/14/24    PATIENT EDUCATION:  Education details: PT POC/goals, hip anatomy/function, role of labrum Person educated: Patient and Spouse Education method: Explanation Education comprehension: verbalized understanding and needs further education  HOME EXERCISE PROGRAM:   ASSESSMENT:  CLINICAL IMPRESSION: Patient is a 78 y.o. F who was seen  today for physical therapy evaluation and treatment for R hip labral tear and proximal hamstring tendon strain.   OBJECTIVE  IMPAIRMENTS: Abnormal gait.   ACTIVITY LIMITATIONS: lifting, bending, sitting, standing, squatting, stairs, transfers, bathing, locomotion level, and caring for others  PARTICIPATION LIMITATIONS: meal prep, cleaning, laundry, driving, community activity, and yard work  PERSONAL FACTORS: Age, Past/current experiences, and Time since onset of injury/illness/exacerbation are also affecting patient's functional outcome.   REHAB POTENTIAL: Excellent  CLINICAL DECISION MAKING: Evolving/moderate complexity  EVALUATION COMPLEXITY: Moderate   GOALS: Goals reviewed with patient? Yes  SHORT TERM GOALS: Target date: 01/31/24 Pt will be instructed on a HEP for R and L LE strengthening 4-5x/week Baseline: at next visit Goal status: INITIAL    LONG TERM GOALS: Target date: 03/11/23  Improve R SLR (hamstring flexibility >10 deg) to facilitate improved ability to perform daily activities without being limited by R hip pain Baseline: L 85, R 65 deg Goal status: INITIAL  2.  Improve R hip abdstrength >1/2 MMT grade to facilitate improved standing and walking tolerance without being limited by R hip pain Baseline: 3+/5 R hip abd Goal status: INITIAL  3.  Improve LEFS >10 points indicating pt significantly less limited in her daily activities by R hip pain/weakness Baseline: 45/80 Goal status: INITIAL   PLAN:  PT FREQUENCY: 2x/week  PT DURATION: 8 weeks  PLANNED INTERVENTIONS: 97110-Therapeutic exercises, 97530- Therapeutic activity, 97112- Neuromuscular re-education, 97535- Self Care, [id number]- Manual therapy, and Patient/Family education  PLAN FOR NEXT SESSION: assess for MTP posterior hip mm; LE strengthening- abductors/gluteals/add/hip flexor; assess stairs  Vernell Reges, PT, DPT, OCS   Vernell FORBES Reges, PT 01/14/2024, 12:39 PM

## 2024-01-17 ENCOUNTER — Ambulatory Visit

## 2024-01-17 DIAGNOSIS — R29898 Other symptoms and signs involving the musculoskeletal system: Secondary | ICD-10-CM

## 2024-01-17 DIAGNOSIS — S73191D Other sprain of right hip, subsequent encounter: Secondary | ICD-10-CM

## 2024-01-17 DIAGNOSIS — M25551 Pain in right hip: Secondary | ICD-10-CM

## 2024-01-24 ENCOUNTER — Ambulatory Visit

## 2024-01-24 ENCOUNTER — Encounter: Payer: Self-pay | Admitting: Internal Medicine

## 2024-01-24 DIAGNOSIS — R29898 Other symptoms and signs involving the musculoskeletal system: Secondary | ICD-10-CM

## 2024-01-24 DIAGNOSIS — M25551 Pain in right hip: Secondary | ICD-10-CM | POA: Diagnosis not present

## 2024-01-24 DIAGNOSIS — S73191D Other sprain of right hip, subsequent encounter: Secondary | ICD-10-CM

## 2024-01-24 NOTE — Therapy (Signed)
 OUTPATIENT PHYSICAL THERAPY LOWER EXTREMITY TREATMENT    Patient Name: Rhonda Lamb MRN: [id number] DOB:1946-02-12, 78 y.o., female Today's Date: 01/24/2024  END OF SESSION:  PT End of Session - 01/24/24 1502     Visit Number 3    Number of Visits 17    Date for Recertification  03/10/24    Authorization Type 2x/week x 8 weeks    PT Start Time 1500    PT Stop Time 1545    PT Time Calculation (min) 45 min           Past Medical History:  Diagnosis Date   Motion sickness    circular motion   Vertigo    none in over 1 year   Past Surgical History:  Procedure Laterality Date   COLONOSCOPY WITH PROPOFOL  N/A 06/06/2020   Procedure: COLONOSCOPY WITH PROPOFOL ;  Surgeon: Jinny Carmine, MD;  Location: [facility];  Service: Endoscopy;  Laterality: N/A;  priority 4   WISDOM TOOTH EXTRACTION     Patient Active Problem List   Diagnosis Date Noted   Osteoarthritis 08/20/2023   Vertigo 08/20/2023   Chronic cough 02/18/2023   Prediabetes 11/20/2022   GAD (generalized anxiety disorder) 02/04/2022   Overweight with body mass index (BMI) of 26 to 26.9 in adult 09/24/2020   Essential hypertension 06/18/2020   Osteoporosis 12/07/2019   Mixed hyperlipidemia 11/16/2019    PCP: Angeline Laura, NP  REFERRING PROVIDER: Belvie Roers, MD  REFERRING DIAG:  Diagnosis  S73.191D (ICD-10-CM) - Tear of right acetabular labrum, subsequent encounter  D23.687J (ICD-10-CM) - Partial tear of left hamstring  S76.311A (ICD-10-CM) - Partial tear of right hamstring    THERAPY DIAG:  Pain in right hip  Weakness of right hip  Tear of right acetabular labrum, subsequent encounter  Rationale for Evaluation and Treatment: Rehabilitation  ONSET DATE: 15 days ago  SUBJECTIVE:   SUBJECTIVE STATEMENT: Pt is here with her husband [name].  MOI: 12/30/23 fall on R outer hip when trying to get out of a hospital recliner bed while visiting her brother at [facility].  Underwent full work up  in [employer].  And was admitted to hospital for tx.  No fx.  Sustained R labral tear (anterior/superior) and hamstring strain/partial tear.  C/o R achy in glute max area, R goin area and runs down into inner thigh  Sx come and go in R hip/groin  Not back to her normal activities  Now able to put weight on R LE- was not initially  CLOF: getting in and out of the shower is concerning, not doing housework, not driving, has everything on first floor she needs- has a second floor of her home- able to do if she needs to   PLOF: was driving, was independent, spending time with family, [place], getting ready for holidays, gardening/landscaping/weeding, helping her brother- does a lot of physical activities to help him   PERTINENT HISTORY: Has concerns of high BP- trying to manage this since the injury too (medication side effect)   Overall, making progress.   PAIN:  Are you having pain? 2/10  PRECAUTIONS: Fall  RED FLAGS: None   WEIGHT BEARING RESTRICTIONS: No  FALLS:  Has patient fallen in last 6 months? Yes. Number of falls 1  LIVING ENVIRONMENT: Lives with: lives with their spouse Lives in: House/apartment Stairs: yes, few to enter and second story Has following equipment at home: Single point cane and Walker - 2 wheeled but not  using  OCCUPATION: not working  PLOF: Independent  PATIENT GOALS: to be able to manage the pain and build up the strength again in her hip to resume her PLOF   NEXT MD VISIT: unknown  OBJECTIVE:  Note: Objective measures were completed at Evaluation unless otherwise noted.  DIAGNOSTIC FINDINGS: full work up in [facility] after injury MRI 12/30/23 IMPRESSION:  1.  No acute fracture.  2.  Soft tissue contusion in the subcutaneous fat of the right lateral proximal thigh.  3.  Moderate bilateral hamstring origin tendinosis with partial tears of the bilateral hamstring tendon origins, high-grade partial on the left.  4.  Mild bilateral  gluteal tendinosis.  5.  Complex tear of the right anterior superior and superior acetabular labrum with intermediate grade juxta labral chondrosis and subchondral cystic changes in the acetabulum. No substantial right hip joint effusion.  6.  Moderate left SI joint degenerative changes with mild subchondral marrow edema.   PATIENT SURVEYS:  LEFS: to be collected  COGNITION: Overall cognitive status: Within functional limits for tasks assessed     SENSATION: WFL   POSTURE:  Hamstring flexibility with SLR: R 65, R 85 deg  PALPATION:  TTP    LOWER EXTREMITY ROM:  Active ROM Right eval Left eval  Hip flexion 105 pain 110  Hip extension    Hip abduction 25 40  Hip adduction    Hip internal rotation 20 20  Hip external rotation 20 30  Knee flexion    Knee extension    Ankle dorsiflexion    Ankle plantarflexion    Ankle inversion    Ankle eversion     (Blank rows = not tested)  LOWER EXTREMITY MMT:  MMT Right eval Left eval  Hip flexion 3+/5 pain 5  Hip extension    Hip abduction 3+/5 pain 4  Hip adduction    Hip internal rotation 4 5  Hip external rotation 4 5  Knee flexion 4 4  Knee extension    Ankle dorsiflexion    Ankle plantarflexion    Ankle inversion    Ankle eversion     (Blank rows = not tested)  LOWER EXTREMITY SPECIAL TESTS:    FUNCTIONAL TESTS:  5 times sit to stand: 15 sec  GAIT: Distance walked: into clinic without AD, + antalgic gait on R noted                                                                                                                                TREATMENT DATE: 01/24/24  Subjective: NSAIDs are helping pain; today R posterior hip mm are tight/sore  Objective: Pain 5/10  Therapeutic Exercise: Nustep x 8 min Hamstring stretch with PT R x30 seconds, 3 reps  Therapeutic Activities: Hip abd:2x10 2# ankle weights Hip extension: 2x10 2# ankle weights Squats: 2x10 Stair assessment- ascend/descend 4 stairs, 3  laps Front step up RLLR 6-inch x 10  Manual Therapy: STM R  posterior hip mm- glute med/max/piriformis   PATIENT EDUCATION:  Education details: PT POC/goals, hip anatomy/function, role of labrum Person educated: Patient and Spouse Education method: Explanation Education comprehension: verbalized understanding and needs further education  HOME EXERCISE PROGRAM:   ASSESSMENT:  CLINICAL IMPRESSION: Patient is a 78 y.o. F who was seen today for physical therapy treatment for R hip labral tear and proximal hamstring tendon strain.  Responded well to manual tx, reports reduced pain in MTPs to 2/10 at end of tx. Should continue to benefit from current POC.   OBJECTIVE IMPAIRMENTS: Abnormal gait.   ACTIVITY LIMITATIONS: lifting, bending, sitting, standing, squatting, stairs, transfers, bathing, locomotion level, and caring for others  PARTICIPATION LIMITATIONS: meal prep, cleaning, laundry, driving, community activity, and yard work  PERSONAL FACTORS: Age, Past/current experiences, and Time since onset of injury/illness/exacerbation are also affecting patient's functional outcome.   REHAB POTENTIAL: Excellent  CLINICAL DECISION MAKING: Evolving/moderate complexity  EVALUATION COMPLEXITY: Moderate   GOALS: Goals reviewed with patient? Yes  SHORT TERM GOALS: Target date: 01/31/24 Pt will be instructed on a HEP for R and L LE strengthening 4-5x/week Baseline: at next visit Goal status: INITIAL   LONG TERM GOALS: Target date: 03/11/23  Improve R SLR (hamstring flexibility >10 deg) to facilitate improved ability to perform daily activities without being limited by R hip pain Baseline: L 85, R 65 deg Goal status: INITIAL  2.  Improve R hip abdstrength >1/2 MMT grade to facilitate improved standing and walking tolerance without being limited by R hip pain Baseline: 3+/5 R hip abd Goal status: INITIAL  3.  Improve LEFS >10 points indicating pt significantly less limited in her  daily activities by R hip pain/weakness Baseline: 45/80 Goal status: INITIAL   PLAN:  PT FREQUENCY: 2x/week  PT DURATION: 8 weeks  PLANNED INTERVENTIONS: 97110-Therapeutic exercises, 97530- Therapeutic activity, 97112- Neuromuscular re-education, 97535- Self Care, [id number]- Manual therapy, and Patient/Family education  PLAN FOR NEXT SESSION: assess for MTP posterior hip mm; LE strengthening- abductors/gluteals/add/hip flexor; assess stairs  Vernell Reges, PT, DPT, OCS   Vernell FORBES Reges, PT 01/24/2024, 3:03 PM

## 2024-01-24 NOTE — Addendum Note (Signed)
 Addended by: Filicia Scogin E on: 01/24/2024 02:30 PM   Modules accepted: Orders

## 2024-01-24 NOTE — Therapy (Signed)
 OUTPATIENT PHYSICAL THERAPY LOWER EXTREMITY TREATMENT 01/17/24   Patient Name: Rhonda Lamb MRN: [id number] DOB:Nov 23, 1945, 78 y.o., female Today's Date: 01/24/2024  END OF SESSION:    Past Medical History:  Diagnosis Date   Motion sickness    circular motion   Vertigo    none in over 1 year   Past Surgical History:  Procedure Laterality Date   COLONOSCOPY WITH PROPOFOL  N/A 06/06/2020   Procedure: COLONOSCOPY WITH PROPOFOL ;  Surgeon: Jinny Carmine, MD;  Location: [facility];  Service: Endoscopy;  Laterality: N/A;  priority 4   WISDOM TOOTH EXTRACTION     Patient Active Problem List   Diagnosis Date Noted   Osteoarthritis 08/20/2023   Vertigo 08/20/2023   Chronic cough 02/18/2023   Prediabetes 11/20/2022   GAD (generalized anxiety disorder) 02/04/2022   Overweight with body mass index (BMI) of 26 to 26.9 in adult 09/24/2020   Essential hypertension 06/18/2020   Osteoporosis 12/07/2019   Mixed hyperlipidemia 11/16/2019    PCP: Angeline Laura, NP  REFERRING PROVIDER: Belvie Roers, MD  REFERRING DIAG:  Diagnosis  S73.191D (ICD-10-CM) - Tear of right acetabular labrum, subsequent encounter  D23.687J (ICD-10-CM) - Partial tear of left hamstring  S76.311A (ICD-10-CM) - Partial tear of right hamstring    THERAPY DIAG:  Pain in right hip  Weakness of right hip  Tear of right acetabular labrum, subsequent encounter  Rationale for Evaluation and Treatment: Rehabilitation  ONSET DATE: 15 days ago  SUBJECTIVE:   SUBJECTIVE STATEMENT: Pt is here with her husband Rhonda Lamb.  MOI: 12/30/23 fall on R outer hip when trying to get out of a hospital recliner bed while visiting her brother at [facility].  Underwent full work up in [employer].  And was admitted to hospital for tx.  No fx.  Sustained R labral tear (anterior/superior) and hamstring strain/partial tear.  C/o R achy in glute max area, R goin area and runs down into inner thigh  Sx come and go in R  hip/groin  Not back to her normal activities  Now able to put weight on R LE- was not initially  CLOF: getting in and out of the shower is concerning, not doing housework, not driving, has everything on first floor she needs- has a second floor of her home- able to do if she needs to   PLOF: was driving, was independent, spending time with family, [place], getting ready for holidays, gardening/landscaping/weeding, helping her brother- does a lot of physical activities to help him   PERTINENT HISTORY: Has concerns of high BP- trying to manage this since the injury too (medication side effect)   Overall, making progress.   PAIN:  Are you having pain? 2/10  PRECAUTIONS: Fall  RED FLAGS: None   WEIGHT BEARING RESTRICTIONS: No  FALLS:  Has patient fallen in last 6 months? Yes. Number of falls 1  LIVING ENVIRONMENT: Lives with: lives with their spouse Lives in: House/apartment Stairs: yes, few to enter and second story Has following equipment at home: Single point cane and Walker - 2 wheeled but not using  OCCUPATION: not working  PLOF: Independent  PATIENT GOALS: to be able to manage the pain and build up the strength again in her hip to resume her PLOF   NEXT MD VISIT: unknown  OBJECTIVE:  Note: Objective measures were completed at Evaluation unless otherwise noted.  DIAGNOSTIC FINDINGS: full work up in E.R after injury MRI 12/30/23 IMPRESSION:  1.  No acute fracture.  2.  Soft tissue contusion in the subcutaneous fat of the right lateral proximal thigh.  3.  Moderate bilateral hamstring origin tendinosis with partial tears of the bilateral hamstring tendon origins, high-grade partial on the left.  4.  Mild bilateral gluteal tendinosis.  5.  Complex tear of the right anterior superior and superior acetabular labrum with intermediate grade juxta labral chondrosis and subchondral cystic changes in the acetabulum. No substantial right hip joint  effusion.  6.  Moderate left SI joint degenerative changes with mild subchondral marrow edema.   PATIENT SURVEYS:  LEFS: to be collected  COGNITION: Overall cognitive status: Within functional limits for tasks assessed     SENSATION: WFL   POSTURE:  Hamstring flexibility with SLR: R 65, R 85 deg  PALPATION:  TTP    LOWER EXTREMITY ROM:  Active ROM Right eval Left eval  Hip flexion 105 pain 110  Hip extension    Hip abduction 25 40  Hip adduction    Hip internal rotation 20 20  Hip external rotation 20 30  Knee flexion    Knee extension    Ankle dorsiflexion    Ankle plantarflexion    Ankle inversion    Ankle eversion     (Blank rows = not tested)  LOWER EXTREMITY MMT:  MMT Right eval Left eval  Hip flexion 3+/5 pain 5  Hip extension    Hip abduction 3+/5 pain 4  Hip adduction    Hip internal rotation 4 5  Hip external rotation 4 5  Knee flexion 4 4  Knee extension    Ankle dorsiflexion    Ankle plantarflexion    Ankle inversion    Ankle eversion     (Blank rows = not tested)  LOWER EXTREMITY SPECIAL TESTS:    FUNCTIONAL TESTS:  5 times sit to stand: 15 sec  GAIT: Distance walked: into clinic without AD, + antalgic gait on R noted                                                                                                                                TREATMENT DATE: 01/17/24  Subjective: NSAIDs are helping pain; today R posterior hip mm are tight/sore  Objective: Pain 5/10  Therapeutic Exercise: Nustep x 8 min Hamstring stretch with PT R x30 seconds, 3 reps  Therapeutic Activities: Hip abd:2x10 2# ankle weights Hip extension: 2x10 2# ankle weights Squats: 2x10 Stair assessment- ascend/descend 4 stairs, 3 laps Front step up RLLR 6-inch x 10  Manual Therapy: STM R posterior hip mm- glute med/max/piriformis   PATIENT EDUCATION:  Education details: PT POC/goals, hip anatomy/function, role of labrum Person educated: Patient and  Spouse Education method: Explanation Education comprehension: verbalized understanding and needs further education  HOME EXERCISE PROGRAM:   ASSESSMENT:  CLINICAL IMPRESSION: Patient is a 78 y.o. F who was seen today for physical therapy treatment for R hip labral tear and proximal hamstring tendon strain.  Responded well to manual tx, reports reduced pain in MTPs to 2/10 at end of tx. Should continue to benefit from current POC.   OBJECTIVE IMPAIRMENTS: Abnormal gait.   ACTIVITY LIMITATIONS: lifting, bending, sitting, standing, squatting, stairs, transfers, bathing, locomotion level, and caring for others  PARTICIPATION LIMITATIONS: meal prep, cleaning, laundry, driving, community activity, and yard work  PERSONAL FACTORS: Age, Past/current experiences, and Time since onset of injury/illness/exacerbation are also affecting patient's functional outcome.   REHAB POTENTIAL: Excellent  CLINICAL DECISION MAKING: Evolving/moderate complexity  EVALUATION COMPLEXITY: Moderate   GOALS: Goals reviewed with patient? Yes  SHORT TERM GOALS: Target date: 01/31/24 Pt will be instructed on a HEP for R and L LE strengthening 4-5x/week Baseline: at next visit Goal status: INITIAL   LONG TERM GOALS: Target date: 03/11/23  Improve R SLR (hamstring flexibility >10 deg) to facilitate improved ability to perform daily activities without being limited by R hip pain Baseline: L 85, R 65 deg Goal status: INITIAL  2.  Improve R hip abdstrength >1/2 MMT grade to facilitate improved standing and walking tolerance without being limited by R hip pain Baseline: 3+/5 R hip abd Goal status: INITIAL  3.  Improve LEFS >10 points indicating pt significantly less limited in her daily activities by R hip pain/weakness Baseline: 45/80 Goal status: INITIAL   PLAN:  PT FREQUENCY: 2x/week  PT DURATION: 8 weeks  PLANNED INTERVENTIONS: 97110-Therapeutic exercises, 97530- Therapeutic activity, 97112-  Neuromuscular re-education, 97535- Self Care, [id number]- Manual therapy, and Patient/Family education  PLAN FOR NEXT SESSION: assess for MTP posterior hip mm; LE strengthening- abductors/gluteals/add/hip flexor; assess stairs  Vernell Reges, PT, DPT, OCS   Vernell FORBES Reges, PT 01/24/2024, 2:40 PM

## 2024-01-25 MED ORDER — MELOXICAM 15 MG PO TABS: 15.0000 mg | ORAL_TABLET | Freq: Every day | ORAL | 0 refills | Status: DC

## 2024-01-26 ENCOUNTER — Ambulatory Visit

## 2024-01-26 DIAGNOSIS — S73191D Other sprain of right hip, subsequent encounter: Secondary | ICD-10-CM

## 2024-01-26 DIAGNOSIS — M25551 Pain in right hip: Secondary | ICD-10-CM | POA: Diagnosis not present

## 2024-01-26 DIAGNOSIS — R29898 Other symptoms and signs involving the musculoskeletal system: Secondary | ICD-10-CM

## 2024-01-29 NOTE — Therapy (Signed)
 OUTPATIENT PHYSICAL THERAPY LOWER EXTREMITY TREATMENT    Patient Name: Rhonda Lamb MRN: [id number] DOB:[birth year], 78 y.o., female Today's Date: 01/29/2024  END OF SESSION:  PT End of Session - 01/29/24 1059     Visit Number 5    Number of Visits 17    Date for Recertification  03/10/24    Authorization Type 2x/week x 8 weeks    PT Start Time 1500    PT Stop Time 1550    PT Time Calculation (min) 50 min            Past Medical History:  Diagnosis Date   Motion sickness    circular motion   Vertigo    none in over 1 year   Past Surgical History:  Procedure Laterality Date   COLONOSCOPY WITH PROPOFOL  N/A 06/06/2020   Procedure: COLONOSCOPY WITH PROPOFOL ;  Surgeon: Jinny Carmine, MD;  Location: [facility];  Service: Endoscopy;  Laterality: N/A;  priority 4   WISDOM TOOTH EXTRACTION     Patient Active Problem List   Diagnosis Date Noted   Osteoarthritis 08/20/2023   Vertigo 08/20/2023   Chronic cough 02/18/2023   Prediabetes 11/20/2022   GAD (generalized anxiety disorder) 02/04/2022   Overweight with body mass index (BMI) of 26 to 26.9 in adult 09/24/2020   Essential hypertension 06/18/2020   Osteoporosis 12/07/2019   Mixed hyperlipidemia 11/16/2019    PCP: Angeline Laura, NP  REFERRING PROVIDER: Belvie Roers, MD  REFERRING DIAG:  Diagnosis  S73.191D (ICD-10-CM) - Tear of right acetabular labrum, subsequent encounter  D23.687J (ICD-10-CM) - Partial tear of left hamstring  S76.311A (ICD-10-CM) - Partial tear of right hamstring    THERAPY DIAG:  Pain in right hip  Weakness of right hip  Tear of right acetabular labrum, subsequent encounter  Rationale for Evaluation and Treatment: Rehabilitation  ONSET DATE: 15 days ago  SUBJECTIVE:   SUBJECTIVE STATEMENT: Pt is here with her husband [name].  MOI: 12/30/23 fall on R outer hip when trying to get out of a hospital recliner bed while visiting her brother at [facility].  Underwent full work  up in [employer].  And was admitted to hospital for tx.  No fx.  Sustained R labral tear (anterior/superior) and hamstring strain/partial tear.  C/o R achy in glute max area, R goin area and runs down into inner thigh  Sx come and go in R hip/groin  Not back to her normal activities  Now able to put weight on R LE- was not initially  CLOF: getting in and out of the shower is concerning, not doing housework, not driving, has everything on first floor she needs- has a second floor of her home- able to do if she needs to   PLOF: was driving, was independent, spending time with family, [place] she [place], getting ready for holidays, gardening/landscaping/weeding, helping her brother- does a lot of physical activities to help him   PERTINENT HISTORY: Has concerns of high BP- trying to manage this since the injury too (medication side effect)   Overall, making progress.   PAIN:  Are you having pain? 2/10  PRECAUTIONS: Fall  RED FLAGS: None   WEIGHT BEARING RESTRICTIONS: No  FALLS:  Has patient fallen in last 6 months? Yes. Number of falls 1  LIVING ENVIRONMENT: Lives with: lives with their spouse Lives in: House/apartment Stairs: yes, few to enter and second story Has following equipment at home: Single point cane and Walker - 2 wheeled but  not using  OCCUPATION: not working  PLOF: Independent  PATIENT GOALS: to be able to manage the pain and build up the strength again in her hip to resume her PLOF   NEXT MD VISIT: unknown  OBJECTIVE:  Note: Objective measures were completed at Evaluation unless otherwise noted.  DIAGNOSTIC FINDINGS: full work up in E.R after injury MRI 12/30/23 IMPRESSION:  1.  No acute fracture.  2.  Soft tissue contusion in the subcutaneous fat of the right lateral proximal thigh.  3.  Moderate bilateral hamstring origin tendinosis with partial tears of the bilateral hamstring tendon origins, high-grade partial on the left.  4.  Mild  bilateral gluteal tendinosis.  5.  Complex tear of the right anterior superior and superior acetabular labrum with intermediate grade juxta labral chondrosis and subchondral cystic changes in the acetabulum. No substantial right hip joint effusion.  6.  Moderate left SI joint degenerative changes with mild subchondral marrow edema.   PATIENT SURVEYS:  LEFS: to be collected  COGNITION: Overall cognitive status: Within functional limits for tasks assessed     SENSATION: WFL   POSTURE:  Hamstring flexibility with SLR: R 65, R 85 deg  PALPATION:  TTP    LOWER EXTREMITY ROM:  Active ROM Right eval Left eval  Hip flexion 105 pain 110  Hip extension    Hip abduction 25 40  Hip adduction    Hip internal rotation 20 20  Hip external rotation 20 30  Knee flexion    Knee extension    Ankle dorsiflexion    Ankle plantarflexion    Ankle inversion    Ankle eversion     (Blank rows = not tested)  LOWER EXTREMITY MMT:  MMT Right eval Left eval  Hip flexion 3+/5 pain 5  Hip extension    Hip abduction 3+/5 pain 4  Hip adduction    Hip internal rotation 4 5  Hip external rotation 4 5  Knee flexion 4 4  Knee extension    Ankle dorsiflexion    Ankle plantarflexion    Ankle inversion    Ankle eversion     (Blank rows = not tested)  LOWER EXTREMITY SPECIAL TESTS:    FUNCTIONAL TESTS:  5 times sit to stand: 15 sec  GAIT: Distance walked: into clinic without AD, + antalgic gait on R noted                                                                                                                                TREATMENT DATE: 01/26/24  Subjective: NSAIDs are helping pain; but they are causing pain in her stomach and she cannot take them regularly.  When she stops taking the medication pain level is high- 7/10 in R hip and she cannot perform her daily activities or her PT HEP.  Left a message for her PCP to ask if she can take a different medicine or get prescription  for something  different.  Still waiting on prescription.  Pain remains a concern for her and she feels it is limiting her progress.  Objective: Pain 7/10 without pain medicine, today 3/10 upon arrival- took NSAIDs this morning  Therapeutic Exercise: Nustep x 8 min- not today Hamstring stretch with PT R x30 seconds, 3 reps R hip PROM through pain free ROM: flex x 5 Isometric hip abd with PT x10 Isometric hip ER with PT x10 Posterior pelvic tilt x 10 Supine bridge x 10  Therapeutic Activities: (after manual therapy) Hip abd:2x10 2# ankle weights (no weights today) Hip extension: 2x10 2# ankle weights (no weights today) Squats: 2x10 with banded lateral distraction (dark blue TB), showed pt how to perform for HEP Stair assessment- ascend/descend 4 stairs, 3 laps- not today Front step up RLLR 6-inch x 10- not today Also discussed sitting posture/position- benefit of sitting with hips slightly higher than knees for comfort/pain control when she is at [employer] this week.   Manual Therapy: 20 min STM R posterior hip mm- glute med/max/piriformis Hip belt mob: lat distraction, inf glide- Gr II/III   hot pack x 10 min at end of session (R hip)  PATIENT EDUCATION:  Education details: PT POC/goals, hip anatomy/function, role of labrum, discussed benefit of reaching out to her PCP to discuss other pain control options that may be more tolerable for her GI system. Person educated: Patient and Spouse Education method: Explanation Education comprehension: verbalized understanding and needs further education  HOME EXERCISE PROGRAM: Access Code: [email or web address] URL: [email or web address] Date: 01/24/2024 Prepared by: Vernell Reges  Exercises - Hooklying Isometric Hip External Rotation  - 2 x daily - 7 x weekly - 3 sets - 5 reps - Supine Hip Abduction  - 2 x daily - 7 x weekly - 3 sets - 5 reps - Supine Posterior Pelvic Tilt  - 2 x daily - 7 x weekly - 3 sets - 15 reps -  Supine Bridge  - 2 x daily - 7 x weekly - 3 sets - 5 reps   ASSESSMENT:  CLINICAL IMPRESSION: Patient is a 78 y.o. F who was seen today for physical therapy treatment for R hip labral tear and proximal hamstring tendon strain.  Responded well to manual tx; notes tightness with hip mob but no increase in pain, and pt able to perform squat without increase in pain after. Overall, her activity tolerance is limited by pain and she is not tolerating NSAIDs well.  Modified tx POC and HEP accordingly today.  Would benefit from contacting PCP to further discuss pain control strategies as PT continues.  Ended with cold pack for pain today.    OBJECTIVE IMPAIRMENTS: Abnormal gait, decreased activity tolerance, decreased balance, decreased ROM, decreased strength, impaired perceived functional ability, and pain.   ACTIVITY LIMITATIONS: lifting, bending, sitting, standing, squatting, stairs, transfers, bathing, locomotion level, and caring for others  PARTICIPATION LIMITATIONS: meal prep, cleaning, laundry, driving, community activity, and yard work  PERSONAL FACTORS: Age, Past/current experiences, and Time since onset of injury/illness/exacerbation are also affecting patient's functional outcome.   REHAB POTENTIAL: Excellent  CLINICAL DECISION MAKING: Evolving/moderate complexity  EVALUATION COMPLEXITY: Moderate   GOALS: Goals reviewed with patient? Yes  SHORT TERM GOALS: Target date: 01/31/24 Pt will be instructed on a HEP for R and L LE strengthening 4-5x/week Baseline: at next visit Goal status: INITIAL   LONG TERM GOALS: Target date: 03/11/23  Improve R SLR (hamstring flexibility >10 deg) to facilitate improved ability to perform daily activities without being  limited by R hip pain Baseline: L 85, R 65 deg Goal status: INITIAL  2.  Improve R hip abdstrength >1/2 MMT grade to facilitate improved standing and walking tolerance without being limited by R hip pain Baseline: 3+/5 R hip  abd Goal status: INITIAL  3.  Improve LEFS >10 points indicating pt significantly less limited in her daily activities by R hip pain/weakness Baseline: 45/80 Goal status: INITIAL   PLAN:  PT FREQUENCY: 2x/week  PT DURATION: 8 weeks  PLANNED INTERVENTIONS: 97110-Therapeutic exercises, 97530- Therapeutic activity, 97112- Neuromuscular re-education, 97535- Self Care, [id number]- Manual therapy, and Patient/Family education  PLAN FOR NEXT SESSION: assess how squats with banded lateral hip distraction went for HEP; contact PCP to inquire about referral to orthopedics for consult  Vernell Reges, PT, DPT, OCS   Vernell FORBES Reges, PT 01/29/2024, 11:00 AM

## 2024-01-31 NOTE — Addendum Note (Signed)
 Addended by: ANTONETTE ANGELINE ORN on: 01/31/2024 09:10 AM   Modules accepted: Orders

## 2024-02-02 ENCOUNTER — Ambulatory Visit

## 2024-02-02 DIAGNOSIS — R29898 Other symptoms and signs involving the musculoskeletal system: Secondary | ICD-10-CM | POA: Insufficient documentation

## 2024-02-02 DIAGNOSIS — S73191D Other sprain of right hip, subsequent encounter: Secondary | ICD-10-CM | POA: Insufficient documentation

## 2024-02-02 DIAGNOSIS — M25551 Pain in right hip: Secondary | ICD-10-CM | POA: Diagnosis present

## 2024-02-02 NOTE — Therapy (Signed)
 OUTPATIENT PHYSICAL THERAPY LOWER EXTREMITY TREATMENT    Patient Name: Rhonda Lamb MRN: [id number] DOB:08/08/1945, 78 y.o., female Today's Date: 02/02/2024  END OF SESSION:  PT End of Session - 02/02/24 1036     Visit Number 6    Number of Visits 17    Date for Recertification  03/10/24    Authorization Type 2x/week x 8 weeks    PT Start Time 1035    PT Stop Time 1120    PT Time Calculation (min) 45 min             Past Medical History:  Diagnosis Date   Motion sickness    circular motion   Vertigo    none in over 1 year   Past Surgical History:  Procedure Laterality Date   COLONOSCOPY WITH PROPOFOL  N/A 06/06/2020   Procedure: COLONOSCOPY WITH PROPOFOL ;  Surgeon: Jinny Carmine, MD;  Location: [facility];  Service: Endoscopy;  Laterality: N/A;  priority 4   WISDOM TOOTH EXTRACTION     Patient Active Problem List   Diagnosis Date Noted   Osteoarthritis 08/20/2023   Vertigo 08/20/2023   Chronic cough 02/18/2023   Prediabetes 11/20/2022   GAD (generalized anxiety disorder) 02/04/2022   Overweight with body mass index (BMI) of 26 to 26.9 in adult 09/24/2020   Essential hypertension 06/18/2020   Osteoporosis 12/07/2019   Mixed hyperlipidemia 11/16/2019    PCP: Angeline Laura, NP  REFERRING PROVIDER: Belvie Roers, MD  REFERRING DIAG:  Diagnosis  S73.191D (ICD-10-CM) - Tear of right acetabular labrum, subsequent encounter  D23.687J (ICD-10-CM) - Partial tear of left hamstring  S76.311A (ICD-10-CM) - Partial tear of right hamstring    THERAPY DIAG:  Pain in right hip  Weakness of right hip  Tear of right acetabular labrum, subsequent encounter  Rationale for Evaluation and Treatment: Rehabilitation  ONSET DATE: 15 days ago  SUBJECTIVE:   SUBJECTIVE STATEMENT: Pt is here with her husband Rhonda Lamb.  MOI: 12/30/23 fall on R outer hip when trying to get out of a hospital recliner bed while visiting her brother at [facility].  Underwent full work  up in [employer].  And was admitted to hospital for tx.  No fx.  Sustained R labral tear (anterior/superior) and hamstring strain/partial tear.  C/o R achy in glute max area, R goin area and runs down into inner thigh  Sx come and go in R hip/groin  Not back to her normal activities  Now able to put weight on R LE- was not initially  CLOF: getting in and out of the shower is concerning, not doing housework, not driving, has everything on first floor she needs- has a second floor of her home- able to do if she needs to   PLOF: was driving, was independent, spending time with family, [place] home she [place], getting ready for holidays, gardening/landscaping/weeding, helping her brother- does a lot of physical activities to help him   PERTINENT HISTORY: Has concerns of high BP- trying to manage this since the injury too (medication side effect)   Overall, making progress.   PAIN:  Are you having pain? 2/10  PRECAUTIONS: Fall  RED FLAGS: None   WEIGHT BEARING RESTRICTIONS: No  FALLS:  Has patient fallen in last 6 months? Yes. Number of falls 1  LIVING ENVIRONMENT: Lives with: lives with their spouse Lives in: House/apartment Stairs: yes, few to enter and second story Has following equipment at home: Single point cane and Environmental Consultant - 2 wheeled  but not using  OCCUPATION: not working  PLOF: Independent  PATIENT GOALS: to be able to manage the pain and build up the strength again in her hip to resume her PLOF   NEXT MD VISIT: unknown  OBJECTIVE:  Note: Objective measures were completed at Evaluation unless otherwise noted.  DIAGNOSTIC FINDINGS: full work up in E.R after injury MRI 12/30/23 IMPRESSION:  1.  No acute fracture.  2.  Soft tissue contusion in the subcutaneous fat of the right lateral proximal thigh.  3.  Moderate bilateral hamstring origin tendinosis with partial tears of the bilateral hamstring tendon origins, high-grade partial on the left.  4.  Mild  bilateral gluteal tendinosis.  5.  Complex tear of the right anterior superior and superior acetabular labrum with intermediate grade juxta labral chondrosis and subchondral cystic changes in the acetabulum. No substantial right hip joint effusion.  6.  Moderate left SI joint degenerative changes with mild subchondral marrow edema.   PATIENT SURVEYS:  LEFS: to be collected  COGNITION: Overall cognitive status: Within functional limits for tasks assessed     SENSATION: WFL   POSTURE:  Hamstring flexibility with SLR: R 65, R 85 deg  PALPATION:  TTP    LOWER EXTREMITY ROM:  Active ROM Right eval Left eval  Hip flexion 105 pain 110  Hip extension    Hip abduction 25 40  Hip adduction    Hip internal rotation 20 20  Hip external rotation 20 30  Knee flexion    Knee extension    Ankle dorsiflexion    Ankle plantarflexion    Ankle inversion    Ankle eversion     (Blank rows = not tested)  LOWER EXTREMITY MMT:  MMT Right eval Left eval  Hip flexion 3+/5 pain 5  Hip extension    Hip abduction 3+/5 pain 4  Hip adduction    Hip internal rotation 4 5  Hip external rotation 4 5  Knee flexion 4 4  Knee extension    Ankle dorsiflexion    Ankle plantarflexion    Ankle inversion    Ankle eversion     (Blank rows = not tested)  LOWER EXTREMITY SPECIAL TESTS:    FUNCTIONAL TESTS:  5 times sit to stand: 15 sec  GAIT: Distance walked: into clinic without AD, + antalgic gait on R noted                                                                                                                                TREATMENT DATE: 02/02/24  Subjective: Pt had consult with [facility], planning to start prescription celebrex instead of current medication.  Option to f/u for hip injection if needed.  R hip pain remains a concern and is limiting her ability to perform her daily activities.  She feels the pain is discouraging.  Does not feel manual therapy was helpful  last session.    Objective: Pain 7/10  without pain medicine, today 3/10 upon arrival- took NSAIDs this morning  Therapeutic Exercise: Nustep x 8 min- not today Isometric hip ER with PT x10 in hooklying, 3 sets Supine bridge x 10, 3 sets R Clam shell x 10 sidelying , 3 sets R hip abd x 5 with PT tactile cues (manual assist for first set), 3 sets   Therapeutic Activities:  Hip abd:2x10 2# ankle weights (no weights today) Hip extension: 2x10 2# ankle weights (no weights today) Squats: with UE support x 10 Squats with elevated mat table behind her- PT tactile and verbal cues to go slower Stair assessment- ascend/descend 4 stairs, 3 laps- not today Front step up RLLR 6-inch x 10- not today Reviewed sitting posture/position- benefit of sitting with hips slightly higher than knees for comfort/pain control when she is at [employer] this week.  Manual Therapy: 20 min- held on this tx today STM R posterior hip mm- glute med/max/piriformis Hip belt mob: lat distraction, inf glide- Gr II/III   PATIENT EDUCATION:  Education details: PT POC/goals, hip anatomy/function, role of labrum, discussed benefit of reaching out to her PCP to discuss other pain control options that may be more tolerable for her GI system. Person educated: Patient and Spouse Education method: Explanation Education comprehension: verbalized understanding and needs further education  HOME EXERCISE PROGRAM: Access Code: [email or web address] URL: [email or web address] Date: 01/24/2024 Prepared by: Vernell Reges  Exercises - Hooklying Isometric Hip External Rotation  - 2 x daily - 7 x weekly - 3 sets - 5 reps - Supine Hip Abduction  - 2 x daily - 7 x weekly - 3 sets - 5 reps - Supine Posterior Pelvic Tilt  - 2 x daily - 7 x weekly - 3 sets - 15 reps - Supine Bridge  - 2 x daily - 7 x weekly - 3 sets - 5 reps   ASSESSMENT:  CLINICAL IMPRESSION: Patient is a 78 y.o. F who was seen today for physical  therapy treatment for R hip labral tear and proximal hamstring tendon strain.  Responded well to verbal/tactile/manual cues for gluteal mm retraining today.  And pt able to perform squat without increase in pain after.  Would benefit from contacting PCP to further discuss pain control strategies as PT continues.  Ended with cold pack for pain today.    OBJECTIVE IMPAIRMENTS: Abnormal gait, decreased activity tolerance, decreased balance, decreased ROM, decreased strength, impaired perceived functional ability, and pain.   ACTIVITY LIMITATIONS: lifting, bending, sitting, standing, squatting, stairs, transfers, bathing, locomotion level, and caring for others  PARTICIPATION LIMITATIONS: meal prep, cleaning, laundry, driving, community activity, and yard work  PERSONAL FACTORS: Age, Past/current experiences, and Time since onset of injury/illness/exacerbation are also affecting patient's functional outcome.   REHAB POTENTIAL: Excellent  CLINICAL DECISION MAKING: Evolving/moderate complexity  EVALUATION COMPLEXITY: Moderate   GOALS: Goals reviewed with patient? Yes  SHORT TERM GOALS: Target date: 01/31/24 Pt will be instructed on a HEP for R and L LE strengthening 4-5x/week Baseline: at next visit Goal status: INITIAL   LONG TERM GOALS: Target date: 03/11/23  Improve R SLR (hamstring flexibility >10 deg) to facilitate improved ability to perform daily activities without being limited by R hip pain Baseline: L 85, R 65 deg Goal status: INITIAL  2.  Improve R hip abdstrength >1/2 MMT grade to facilitate improved standing and walking tolerance without being limited by R hip pain Baseline: 3+/5 R hip abd Goal status: INITIAL  3.  Improve LEFS >10 points indicating pt significantly  less limited in her daily activities by R hip pain/weakness Baseline: 45/80 Goal status: INITIAL   PLAN:  PT FREQUENCY: 2x/week  PT DURATION: 8 weeks  PLANNED INTERVENTIONS: 97110-Therapeutic exercises,  97530- Therapeutic activity, 97112- Neuromuscular re-education, 97535- Self Care, [id number]- Manual therapy, and Patient/Family education  PLAN FOR NEXT SESSION: assess how squats with banded lateral hip distraction went for HEP; contact PCP to inquire about referral to orthopedics for consult  Vernell Reges, PT, DPT, OCS   Vernell FORBES Reges, PT 02/02/2024, 10:37 AM

## 2024-02-04 ENCOUNTER — Ambulatory Visit

## 2024-02-04 DIAGNOSIS — R29898 Other symptoms and signs involving the musculoskeletal system: Secondary | ICD-10-CM

## 2024-02-04 DIAGNOSIS — S73191D Other sprain of right hip, subsequent encounter: Secondary | ICD-10-CM

## 2024-02-04 DIAGNOSIS — M25551 Pain in right hip: Secondary | ICD-10-CM | POA: Diagnosis not present

## 2024-02-04 NOTE — Therapy (Signed)
 OUTPATIENT PHYSICAL THERAPY LOWER EXTREMITY TREATMENT    Patient Name: Rhonda Lamb MRN: [id number] DOB:[birth year], 78 y.o., female Today's Date: 02/04/2024  END OF SESSION:  PT End of Session - 02/04/24 1138     Visit Number 7    Number of Visits 17    Date for Recertification  03/10/24    Authorization Type 2x/week x 8 weeks    PT Start Time 1022    PT Stop Time 1107    PT Time Calculation (min) 45 min              Past Medical History:  Diagnosis Date   Motion sickness    circular motion   Vertigo    none in over 1 year   Past Surgical History:  Procedure Laterality Date   COLONOSCOPY WITH PROPOFOL  N/A 06/06/2020   Procedure: COLONOSCOPY WITH PROPOFOL ;  Surgeon: Jinny Carmine, MD;  Location: [facility];  Service: Endoscopy;  Laterality: N/A;  priority 4   WISDOM TOOTH EXTRACTION     Patient Active Problem List   Diagnosis Date Noted   Osteoarthritis 08/20/2023   Vertigo 08/20/2023   Chronic cough 02/18/2023   Prediabetes 11/20/2022   GAD (generalized anxiety disorder) 02/04/2022   Overweight with body mass index (BMI) of 26 to 26.9 in adult 09/24/2020   Essential hypertension 06/18/2020   Osteoporosis 12/07/2019   Mixed hyperlipidemia 11/16/2019    PCP: Angeline Laura, NP  REFERRING PROVIDER: Belvie Roers, MD  REFERRING DIAG:  Diagnosis  S73.191D (ICD-10-CM) - Tear of right acetabular labrum, subsequent encounter  D23.687J (ICD-10-CM) - Partial tear of left hamstring  S76.311A (ICD-10-CM) - Partial tear of right hamstring    THERAPY DIAG:  Pain in right hip  Weakness of right hip  Tear of right acetabular labrum, subsequent encounter  Rationale for Evaluation and Treatment: Rehabilitation  ONSET DATE: 15 days ago  SUBJECTIVE:   SUBJECTIVE STATEMENT: Pt is here with her husband [name].  MOI: 12/30/23 fall on R outer hip when trying to get out of a hospital recliner bed while visiting her brother at [facility].  Underwent full  work up in [employer].  And was admitted to hospital for tx.  No fx.  Sustained R labral tear (anterior/superior) and hamstring strain/partial tear.  C/o R achy in glute max area, R goin area and runs down into inner thigh  Sx come and go in R hip/groin  Not back to her normal activities  Now able to put weight on R LE- was not initially  CLOF: getting in and out of the shower is concerning, not doing housework, not driving, has everything on first floor she needs- has a second floor of her home- able to do if she needs to   PLOF: was driving, was independent, spending time with family, [place] she [place], getting ready for holidays, gardening/landscaping/weeding, helping her brother- does a lot of physical activities to help him   PERTINENT HISTORY: Has concerns of high BP- trying to manage this since the injury too (medication side effect)   Overall, making progress.   PAIN:  Are you having pain? 2/10  PRECAUTIONS: Fall  RED FLAGS: None   WEIGHT BEARING RESTRICTIONS: No  FALLS:  Has patient fallen in last 6 months? Yes. Number of falls 1  LIVING ENVIRONMENT: Lives with: lives with their spouse Lives in: House/apartment Stairs: yes, few to enter and second story Has following equipment at home: Single point cane and Walker - 2  wheeled but not using  OCCUPATION: not working  PLOF: Independent  PATIENT GOALS: to be able to manage the pain and build up the strength again in her hip to resume her PLOF   NEXT MD VISIT: unknown  OBJECTIVE:  Note: Objective measures were completed at Evaluation unless otherwise noted.  DIAGNOSTIC FINDINGS: full work up in E.R after injury MRI 12/30/23 IMPRESSION:  1.  No acute fracture.  2.  Soft tissue contusion in the subcutaneous fat of the right lateral proximal thigh.  3.  Moderate bilateral hamstring origin tendinosis with partial tears of the bilateral hamstring tendon origins, high-grade partial on the left.  4.  Mild  bilateral gluteal tendinosis.  5.  Complex tear of the right anterior superior and superior acetabular labrum with intermediate grade juxta labral chondrosis and subchondral cystic changes in the acetabulum. No substantial right hip joint effusion.  6.  Moderate left SI joint degenerative changes with mild subchondral marrow edema.   PATIENT SURVEYS:  LEFS: to be collected  COGNITION: Overall cognitive status: Within functional limits for tasks assessed     SENSATION: WFL   POSTURE:  Hamstring flexibility with SLR: R 65, R 85 deg  PALPATION:  TTP    LOWER EXTREMITY ROM:  Active ROM Right eval Left eval  Hip flexion 105 pain 110  Hip extension    Hip abduction 25 40  Hip adduction    Hip internal rotation 20 20  Hip external rotation 20 30  Knee flexion    Knee extension    Ankle dorsiflexion    Ankle plantarflexion    Ankle inversion    Ankle eversion     (Blank rows = not tested)  LOWER EXTREMITY MMT:  MMT Right eval Left eval  Hip flexion 3+/5 pain 5  Hip extension    Hip abduction 3+/5 pain 4  Hip adduction    Hip internal rotation 4 5  Hip external rotation 4 5  Knee flexion 4 4  Knee extension    Ankle dorsiflexion    Ankle plantarflexion    Ankle inversion    Ankle eversion     (Blank rows = not tested)  LOWER EXTREMITY SPECIAL TESTS:    FUNCTIONAL TESTS:  5 times sit to stand: 15 sec  GAIT: Distance walked: into clinic without AD, + antalgic gait on R noted                                                                                                                                TREATMENT DATE: 02/04/24  Subjective: Pt arrived today stating that her hip pain has improved since last session.  She very glad about this.  Felt good after last PT session.  Objective: Pain today 0/10 upon arrival  Therapeutic Exercise: Nustep x 8 min- not today Isometric hip ER with PT x10 in hooklying, 3 sets Supine bridge x 10, 3 sets R Clam shell x  10  sidelying , 3 sets R hip abd x 5 with PT tactile cues (manual assist for first set), 3 sets   Therapeutic Activities:  Hip abd:2x10 2# ankle weights (no weights today) Hip extension: 2x10 2# ankle weights (no weights today) Squats: with UE support x 10, 2 sets (sit to stand from high mat table) Squats with elevated mat table behind her- PT tactile and verbal cues to go slower Stair assessment- ascend/descend 4 stairs with reciprocal gait pattern and 1 railing Front step up RLLR 6-inch x 10- not today Alternating toe taps on 6 inch step x10, 2 sets Tandem stance on airex x 30 second intervals, 3x ea side Reviewed sitting posture/position- benefit of sitting with hips slightly higher than knees for comfort/pain control when she is at [employer] this week.  Manual Therapy: 20 min- held on this tx today STM R posterior hip mm- glute med/max/piriformis Hip belt mob: lat distraction, inf glide- Gr II/III   PATIENT EDUCATION:  Education details: PT POC/goals, hip anatomy/function, role of labrum, discussed benefit of reaching out to her PCP to discuss other pain control options that may be more tolerable for her GI system. Person educated: Patient and Spouse Education method: Explanation Education comprehension: verbalized understanding and needs further education  HOME EXERCISE PROGRAM: Access Code: [email or web address] URL: [email or web address] Date: 01/24/2024 Prepared by: Vernell Reges  Exercises - Hooklying Isometric Hip External Rotation  - 2 x daily - 7 x weekly - 3 sets - 5 reps - Supine Hip Abduction  - 2 x daily - 7 x weekly - 3 sets - 5 reps - Supine Posterior Pelvic Tilt  - 2 x daily - 7 x weekly - 3 sets - 15 reps - Supine Bridge  - 2 x daily - 7 x weekly - 3 sets - 5 reps   ASSESSMENT:  CLINICAL IMPRESSION: Patient is a 78 y.o. F who was seen today for physical therapy treatment for R hip labral tear and proximal hamstring tendon strain.  Primary  impairment today is lack of mm strength; pain is not primary impairment upon arrival.  Able to progress with mm retraining activities in standing positions today without pt reporting increased pain.  Still notes 0/10 at end of session today.  Would benefit from continued skilled PT to address impairments and facilitate return to PLOF.  OBJECTIVE IMPAIRMENTS: Abnormal gait, decreased activity tolerance, decreased balance, decreased ROM, decreased strength, impaired perceived functional ability, and pain.   ACTIVITY LIMITATIONS: lifting, bending, sitting, standing, squatting, stairs, transfers, bathing, locomotion level, and caring for others  PARTICIPATION LIMITATIONS: meal prep, cleaning, laundry, driving, community activity, and yard work  PERSONAL FACTORS: Age, Past/current experiences, and Time since onset of injury/illness/exacerbation are also affecting patient's functional outcome.   REHAB POTENTIAL: Excellent  CLINICAL DECISION MAKING: Evolving/moderate complexity  EVALUATION COMPLEXITY: Moderate   GOALS: Goals reviewed with patient? Yes  SHORT TERM GOALS: Target date: 01/31/24 Pt will be instructed on a HEP for R and L LE strengthening 4-5x/week Baseline: at next visit Goal status: INITIAL   LONG TERM GOALS: Target date: 03/11/23  Improve R SLR (hamstring flexibility >10 deg) to facilitate improved ability to perform daily activities without being limited by R hip pain Baseline: L 85, R 65 deg Goal status: INITIAL  2.  Improve R hip abdstrength >1/2 MMT grade to facilitate improved standing and walking tolerance without being limited by R hip pain Baseline: 3+/5 R hip abd Goal status: INITIAL  3.  Improve LEFS >10 points indicating pt  significantly less limited in her daily activities by R hip pain/weakness Baseline: 45/80 Goal status: INITIAL   PLAN:  PT FREQUENCY: 2x/week  PT DURATION: 8 weeks  PLANNED INTERVENTIONS: 97110-Therapeutic exercises, 97530-  Therapeutic activity, 97112- Neuromuscular re-education, 97535- Self Care, [id number]- Manual therapy, and Patient/Family education  PLAN FOR NEXT SESSION: assess how squats with banded lateral hip distraction went for HEP; contact PCP to inquire about referral to orthopedics for consult  Vernell Reges, PT, DPT, OCS   Vernell FORBES Reges, PT 02/04/2024, 11:39 AM

## 2024-02-09 ENCOUNTER — Other Ambulatory Visit: Payer: Self-pay

## 2024-02-09 ENCOUNTER — Ambulatory Visit

## 2024-02-09 DIAGNOSIS — S73191D Other sprain of right hip, subsequent encounter: Secondary | ICD-10-CM

## 2024-02-09 DIAGNOSIS — R29898 Other symptoms and signs involving the musculoskeletal system: Secondary | ICD-10-CM

## 2024-02-09 DIAGNOSIS — M25551 Pain in right hip: Secondary | ICD-10-CM | POA: Diagnosis not present

## 2024-02-09 MED ORDER — OMEPRAZOLE 20 MG PO CPDR: 20.0000 mg | DELAYED_RELEASE_CAPSULE | Freq: Every day | ORAL | 1 refills | Status: AC

## 2024-02-09 MED ORDER — OYSTER SHELL CALCIUM/D3 500-5 MG-MCG PO TABS: 2.0000 | ORAL_TABLET | Freq: Every day | ORAL | 1 refills | Status: DC

## 2024-02-09 MED ORDER — CETIRIZINE HCL 10 MG PO TABS: 10.0000 mg | ORAL_TABLET | Freq: Every day | ORAL | 1 refills | Status: AC

## 2024-02-09 NOTE — Therapy (Signed)
 OUTPATIENT PHYSICAL THERAPY LOWER EXTREMITY TREATMENT    Patient Name: Rhonda Lamb MRN: [id number] DOB:06-05-1945, 78 y.o., female Today's Date: 02/09/2024  END OF SESSION:  PT End of Session - 02/09/24 1143     Visit Number 8    Number of Visits 17    Date for Recertification  03/10/24    Authorization Type 2x/week x 8 weeks    PT Start Time 1040    PT Stop Time 1130    PT Time Calculation (min) 50 min              Past Medical History:  Diagnosis Date   Motion sickness    circular motion   Vertigo    none in over 1 year   Past Surgical History:  Procedure Laterality Date   COLONOSCOPY WITH PROPOFOL  N/A 06/06/2020   Procedure: COLONOSCOPY WITH PROPOFOL ;  Surgeon: Jinny Carmine, MD;  Location: [facility];  Service: Endoscopy;  Laterality: N/A;  priority 4   WISDOM TOOTH EXTRACTION     Patient Active Problem List   Diagnosis Date Noted   Osteoarthritis 08/20/2023   Vertigo 08/20/2023   Chronic cough 02/18/2023   Prediabetes 11/20/2022   GAD (generalized anxiety disorder) 02/04/2022   Overweight with body mass index (BMI) of 26 to 26.9 in adult 09/24/2020   Essential hypertension 06/18/2020   Osteoporosis 12/07/2019   Mixed hyperlipidemia 11/16/2019    PCP: Angeline Laura, NP  REFERRING PROVIDER: Belvie Roers, MD  REFERRING DIAG:  Diagnosis  S73.191D (ICD-10-CM) - Tear of right acetabular labrum, subsequent encounter  D23.687J (ICD-10-CM) - Partial tear of left hamstring  S76.311A (ICD-10-CM) - Partial tear of right hamstring    THERAPY DIAG:  Pain in right hip  Weakness of right hip  Tear of right acetabular labrum, subsequent encounter  Rationale for Evaluation and Treatment: Rehabilitation  ONSET DATE: 15 days ago  SUBJECTIVE:   SUBJECTIVE STATEMENT: Pt is here with her husband [name].  MOI: 12/30/23 fall on R outer hip when trying to get out of a hospital recliner bed while visiting her brother at [facility].  Underwent full  work up in [employer].  And was admitted to hospital for tx.  No fx.  Sustained R labral tear (anterior/superior) and hamstring strain/partial tear.  C/o R achy in glute max area, R goin area and runs down into inner thigh  Sx come and go in R hip/groin  Not back to her normal activities  Now able to put weight on R LE- was not initially  CLOF: getting in and out of the shower is concerning, not doing housework, not driving, has everything on first floor she needs- has a second floor of her home- able to do if she needs to   PLOF: was driving, was independent, spending time with family, [place] home she [place], getting ready for holidays, gardening/landscaping/weeding, helping her brother- does a lot of physical activities to help him   PERTINENT HISTORY: Has concerns of high BP- trying to manage this since the injury too (medication side effect)   Overall, making progress.   PAIN:  Are you having pain? 2/10  PRECAUTIONS: Fall  RED FLAGS: None   WEIGHT BEARING RESTRICTIONS: No  FALLS:  Has patient fallen in last 6 months? Yes. Number of falls 1  LIVING ENVIRONMENT: Lives with: lives with their spouse Lives in: House/apartment Stairs: yes, few to enter and second story Has following equipment at home: Single point cane and Walker - 2  wheeled but not using  OCCUPATION: not working  PLOF: Independent  PATIENT GOALS: to be able to manage the pain and build up the strength again in her hip to resume her PLOF   NEXT MD VISIT: unknown  OBJECTIVE:  Note: Objective measures were completed at Evaluation unless otherwise noted.  DIAGNOSTIC FINDINGS: full work up in E.R after injury MRI 12/30/23 IMPRESSION:  1.  No acute fracture.  2.  Soft tissue contusion in the subcutaneous fat of the right lateral proximal thigh.  3.  Moderate bilateral hamstring origin tendinosis with partial tears of the bilateral hamstring tendon origins, high-grade partial on the left.  4.  Mild  bilateral gluteal tendinosis.  5.  Complex tear of the right anterior superior and superior acetabular labrum with intermediate grade juxta labral chondrosis and subchondral cystic changes in the acetabulum. No substantial right hip joint effusion.  6.  Moderate left SI joint degenerative changes with mild subchondral marrow edema.   PATIENT SURVEYS:  LEFS: to be collected  COGNITION: Overall cognitive status: Within functional limits for tasks assessed     SENSATION: WFL   POSTURE:  Hamstring flexibility with SLR: R 65, R 85 deg  PALPATION:  TTP    LOWER EXTREMITY ROM:  Active ROM Right eval Left eval  Hip flexion 105 pain 110  Hip extension    Hip abduction 25 40  Hip adduction    Hip internal rotation 20 20  Hip external rotation 20 30  Knee flexion    Knee extension    Ankle dorsiflexion    Ankle plantarflexion    Ankle inversion    Ankle eversion     (Blank rows = not tested)  LOWER EXTREMITY MMT:  MMT Right eval Left eval  Hip flexion 3+/5 pain 5  Hip extension    Hip abduction 3+/5 pain 4  Hip adduction    Hip internal rotation 4 5  Hip external rotation 4 5  Knee flexion 4 4  Knee extension    Ankle dorsiflexion    Ankle plantarflexion    Ankle inversion    Ankle eversion     (Blank rows = not tested)  LOWER EXTREMITY SPECIAL TESTS:    FUNCTIONAL TESTS:  5 times sit to stand: 15 sec  GAIT: Distance walked: into clinic without AD, + antalgic gait on R noted                                                                                                                                TREATMENT DATE: 02/09/24  Subjective: Pt hosted a holiday party yesterday- spent lots of hours on her feet hosting and prepping.  She also wore some boots with heels.  Last night she reports 7/10 pain and had difficulty sleeping.  Plans to go grocery shopping and do some more cooking/baking this afternoon.  Objective: Pain today 4-5/10 upon arrival Moist heat  x10 min to start for pain  control  Therapeutic Exercise: Nustep x 8 min- not today Isometric hip ER with PT x10 in hooklying, 3 sets Supine bridge x 10, 3 sets R Clam shell x 10 sidelying , 3 sets R hip abd x 5 with PT tactile cues (manual assist for first set), 3 sets SAQ x 10, 3 sets   Therapeutic Activities: held on this Hip abd:2x10 2# ankle weights (no weights today) Hip extension: 2x10 2# ankle weights (no weights today) Squats: with UE support x 10, 2 sets (sit to stand from high mat table) Squats with elevated mat table behind her- PT tactile and verbal cues to go slower Stair assessment- ascend/descend 4 stairs with reciprocal gait pattern and 1 railing Front step up RLLR 6-inch x 10- not today Alternating toe taps on 6 inch step x10, 2 sets Tandem stance on airex x 30 second intervals, 3x ea side Reviewed sitting posture/position- benefit of sitting with hips slightly higher than knees for comfort/pain control when she is at [employer] this week.  Manual Therapy: STM R posterior hip mm- glute med/max/piriformis/hamstring, used TB green roller too Manual QL stretch 1 min x 2 Hip belt mob: lat distraction, inf glide- Gr II/III- not today   PATIENT EDUCATION:  Education details: PT POC/goals, hip anatomy/function, role of labrum, discussed activity modification/pacing Person educated: Patient and Spouse Education method: Explanation Education comprehension: verbalized understanding and needs further education  HOME EXERCISE PROGRAM: Access Code: [email or web address] URL: [email or web address] Date: 01/24/2024 Prepared by: Vernell Reges  Exercises - Hooklying Isometric Hip External Rotation  - 2 x daily - 7 x weekly - 3 sets - 5 reps - Supine Hip Abduction  - 2 x daily - 7 x weekly - 3 sets - 5 reps - Supine Posterior Pelvic Tilt  - 2 x daily - 7 x weekly - 3 sets - 15 reps - Supine Bridge  - 2 x daily - 7 x weekly - 3 sets - 5  reps   ASSESSMENT:  CLINICAL IMPRESSION: Patient is a 78 y.o. F who was seen today for physical therapy treatment for R hip labral tear and proximal hamstring tendon strain.  Primary impairment today is lack of mm strength; pain is not primary impairment upon arrival.  Arrived today with c/o increased pain after increased activity yesterday.  Focused on manual therapy and gentle therapeutic exercises for pain control.  Used moist heat for pain control.  Discussed activity pacing/modification today with taking a few siting breaks on tall stool in her kitchen for sx control.  Would benefit from continued skilled PT to address impairments and facilitate return to PLOF.  OBJECTIVE IMPAIRMENTS: Abnormal gait, decreased activity tolerance, decreased balance, decreased ROM, decreased strength, impaired perceived functional ability, and pain.   ACTIVITY LIMITATIONS: lifting, bending, sitting, standing, squatting, stairs, transfers, bathing, locomotion level, and caring for others  PARTICIPATION LIMITATIONS: meal prep, cleaning, laundry, driving, community activity, and yard work  PERSONAL FACTORS: Age, Past/current experiences, and Time since onset of injury/illness/exacerbation are also affecting patient's functional outcome.   REHAB POTENTIAL: Excellent  CLINICAL DECISION MAKING: Evolving/moderate complexity  EVALUATION COMPLEXITY: Moderate   GOALS: Goals reviewed with patient? Yes  SHORT TERM GOALS: Target date: 01/31/24 Pt will be instructed on a HEP for R and L LE strengthening 4-5x/week Baseline: at next visit Goal status: INITIAL   LONG TERM GOALS: Target date: 03/11/23  Improve R SLR (hamstring flexibility >10 deg) to facilitate improved ability to perform daily activities without being limited by R hip pain Baseline:  L 85, R 65 deg Goal status: INITIAL  2.  Improve R hip abdstrength >1/2 MMT grade to facilitate improved standing and walking tolerance without being limited by R hip  pain Baseline: 3+/5 R hip abd Goal status: INITIAL  3.  Improve LEFS >10 points indicating pt significantly less limited in her daily activities by R hip pain/weakness Baseline: 45/80 Goal status: INITIAL   PLAN:  PT FREQUENCY: 2x/week  PT DURATION: 8 weeks  PLANNED INTERVENTIONS: 97110-Therapeutic exercises, 97530- Therapeutic activity, 97112- Neuromuscular re-education, 97535- Self Care, [id number]- Manual therapy, and Patient/Family education  PLAN FOR NEXT SESSION: continue with hip strengthening  Vernell Reges, PT, DPT, OCS   Vernell FORBES Reges, PT 02/09/2024, 11:44 AM

## 2024-02-11 ENCOUNTER — Ambulatory Visit

## 2024-02-11 DIAGNOSIS — M25551 Pain in right hip: Secondary | ICD-10-CM

## 2024-02-11 DIAGNOSIS — S73191D Other sprain of right hip, subsequent encounter: Secondary | ICD-10-CM

## 2024-02-11 DIAGNOSIS — R29898 Other symptoms and signs involving the musculoskeletal system: Secondary | ICD-10-CM

## 2024-02-11 NOTE — Therapy (Signed)
 OUTPATIENT PHYSICAL THERAPY LOWER EXTREMITY TREATMENT    Patient Name: Rhonda Lamb MRN: [id number] DOB:11-Nov-1945, 78 y.o., female Today's Date: 02/11/2024  END OF SESSION:  PT End of Session - 02/11/24 1032     Visit Number 9    Number of Visits 17    Date for Recertification  03/10/24    Authorization Type 2x/week x 8 weeks    PT Start Time 1025    PT Stop Time 1120    PT Time Calculation (min) 55 min               Past Medical History:  Diagnosis Date   Motion sickness    circular motion   Vertigo    none in over 1 year   Past Surgical History:  Procedure Laterality Date   COLONOSCOPY WITH PROPOFOL  N/A 06/06/2020   Procedure: COLONOSCOPY WITH PROPOFOL ;  Surgeon: Rhonda Carmine, MD;  Location: [facility];  Service: Endoscopy;  Laterality: N/A;  priority 4   WISDOM TOOTH EXTRACTION     Patient Active Problem List   Diagnosis Date Noted   Osteoarthritis 08/20/2023   Vertigo 08/20/2023   Chronic cough 02/18/2023   Prediabetes 11/20/2022   GAD (generalized anxiety disorder) 02/04/2022   Overweight with body mass index (BMI) of 26 to 26.9 in adult 09/24/2020   Essential hypertension 06/18/2020   Osteoporosis 12/07/2019   Mixed hyperlipidemia 11/16/2019    PCP: Rhonda Laura, NP  REFERRING PROVIDER: Belvie Roers, MD  REFERRING DIAG:  Diagnosis  S73.191D (ICD-10-CM) - Tear of right acetabular labrum, subsequent encounter  D23.687J (ICD-10-CM) - Partial tear of left hamstring  S76.311A (ICD-10-CM) - Partial tear of right hamstring    THERAPY DIAG:  Pain in right hip  Weakness of right hip  Tear of right acetabular labrum, subsequent encounter  Rationale for Evaluation and Treatment: Rehabilitation  ONSET DATE: 15 days ago  SUBJECTIVE:   SUBJECTIVE STATEMENT: Pt is here with her husband Rhonda Lamb.  MOI: 12/30/23 fall on R outer hip when trying to get out of a hospital recliner bed while visiting her brother at [facility].  Underwent full  work up in [employer].  And was admitted to hospital for tx.  No fx.  Sustained R labral tear (anterior/superior) and hamstring strain/partial tear.  C/o R achy in glute max area, R goin area and runs down into inner thigh  Sx come and go in R hip/groin  Not back to her normal activities  Now able to put weight on R LE- was not initially  CLOF: getting in and out of the shower is concerning, not doing housework, not driving, has everything on first floor she needs- has a second floor of her home- able to do if she needs to   PLOF: was driving, was independent, spending time with family, [place] she [place], getting ready for holidays, gardening/landscaping/weeding, helping her brother- does a lot of physical activities to help him   PERTINENT HISTORY: Has concerns of high BP- trying to manage this since the injury too (medication side effect)   Overall, making progress.   PAIN:  Are you having pain? 2/10  PRECAUTIONS: Fall  RED FLAGS: None   WEIGHT BEARING RESTRICTIONS: No  FALLS:  Has patient fallen in last 6 months? Yes. Number of falls 1  LIVING ENVIRONMENT: Lives with: lives with their spouse Lives in: House/apartment Stairs: yes, few to enter and second story Has following equipment at home: Single point cane and Environmental Consultant -  2 wheeled but not using  OCCUPATION: not working  PLOF: Independent  PATIENT GOALS: to be able to manage the pain and build up the strength again in her hip to resume her PLOF   NEXT MD VISIT: unknown  OBJECTIVE:  Note: Objective measures were completed at Evaluation unless otherwise noted.  DIAGNOSTIC FINDINGS: full work up in E.R after injury MRI 12/30/23 IMPRESSION:  1.  No acute fracture.  2.  Soft tissue contusion in the subcutaneous fat of the right lateral proximal thigh.  3.  Moderate bilateral hamstring origin tendinosis with partial tears of the bilateral hamstring tendon origins, high-grade partial on the left.  4.  Mild  bilateral gluteal tendinosis.  5.  Complex tear of the right anterior superior and superior acetabular labrum with intermediate grade juxta labral chondrosis and subchondral cystic changes in the acetabulum. No substantial right hip joint effusion.  6.  Moderate left SI joint degenerative changes with mild subchondral marrow edema.   PATIENT SURVEYS:  LEFS: to be collected  COGNITION: Overall cognitive status: Within functional limits for tasks assessed     SENSATION: WFL   POSTURE:  Hamstring flexibility with SLR: R 65, R 85 deg  PALPATION:  TTP    LOWER EXTREMITY ROM:  Active ROM Right eval Left eval  Hip flexion 105 pain 110  Hip extension    Hip abduction 25 40  Hip adduction    Hip internal rotation 20 20  Hip external rotation 20 30  Knee flexion    Knee extension    Ankle dorsiflexion    Ankle plantarflexion    Ankle inversion    Ankle eversion     (Blank rows = not tested)  LOWER EXTREMITY MMT:  MMT Right eval Left eval  Hip flexion 3+/5 pain 5  Hip extension    Hip abduction 3+/5 pain 4  Hip adduction    Hip internal rotation 4 5  Hip external rotation 4 5  Knee flexion 4 4  Knee extension    Ankle dorsiflexion    Ankle plantarflexion    Ankle inversion    Ankle eversion     (Blank rows = not tested)  LOWER EXTREMITY SPECIAL TESTS:    FUNCTIONAL TESTS:  5 times sit to stand: 15 sec  GAIT: Distance walked: into clinic without AD, + antalgic gait on R noted                                                                                                                                TREATMENT DATE: 02/11/24  Subjective: Pt reports her hip is sore if she does a lot of standing activities at home/in kitchen.  Objective: Pain today 4-5/10 upon arrival Moist heat x10 min to start for pain control (unbilled time)  Therapeutic Exercise: Nustep x 8 min-  Isometric hip ER with PT x10 in hooklying, 3 sets Supine bridge x 10, 3 sets R Clam  shell  x 10 sidelying , 3 sets R hip abd x 5 with PT tactile cues (manual assist for first set), 3 sets SAQ x 10, 3 sets - not today  Therapeutic Activities: held on this Hip abd:2x10 2# ankle weights (no weights today) Hip extension: 2x10 2# ankle weights (no weights today) Squats with elevated mat table behind her- PT tactile and verbal cues to go slower, 2x10 Front step up RLLR 6-inch x 10- not today Alternating toe taps on 6 inch step x10, 2 sets Tandem stance on airex x 30 second intervals, 3x ea side- not today Lateral stepping along parallel bar 4 laps Reviewed sitting posture/position- benefit of sitting with hips slightly higher than knees for comfort/pain control when sitting; reviewed importance of activity pacing/modification for sx control  Manual Therapy: STM R posterior hip mm- glute med/max/piriformis/hamstring, used TB green roller too Manual QL stretch 1 min x 2 Hip belt mob: lat distraction, inf glide- Gr II/III- not today   PATIENT EDUCATION:  Education details: PT POC/goals, hip anatomy/function, role of labrum, discussed activity modification/pacing Person educated: Patient and Spouse Education method: Explanation Education comprehension: verbalized understanding and needs further education  HOME EXERCISE PROGRAM: Access Code: [email or web address] URL: [email or web address] Date: 01/24/2024 Prepared by: Vernell Reges  Exercises - Hooklying Isometric Hip External Rotation  - 2 x daily - 7 x weekly - 3 sets - 5 reps - Supine Hip Abduction  - 2 x daily - 7 x weekly - 3 sets - 5 reps - Supine Posterior Pelvic Tilt  - 2 x daily - 7 x weekly - 3 sets - 15 reps - Supine Bridge  - 2 x daily - 7 x weekly - 3 sets - 5 reps   ASSESSMENT:  CLINICAL IMPRESSION: Patient is a 78 y.o. F who was seen today for physical therapy treatment for R hip labral tear and proximal hamstring tendon strain.  Discussed activity pacing/modification again today with taking a few  siting breaks on tall stool in her kitchen for sx control.  Progressed with hip mm strengthening and pt reports 0/10 pain in hip at end of session.  Discussed benefit of continuing with PT for strengthening as this will facilitate optimal long term recovery.  Would benefit from continued skilled PT to address impairments and facilitate return to PLOF.  OBJECTIVE IMPAIRMENTS: Abnormal gait, decreased activity tolerance, decreased balance, decreased ROM, decreased strength, impaired perceived functional ability, and pain.   ACTIVITY LIMITATIONS: lifting, bending, sitting, standing, squatting, stairs, transfers, bathing, locomotion level, and caring for others  PARTICIPATION LIMITATIONS: meal prep, cleaning, laundry, driving, community activity, and yard work  PERSONAL FACTORS: Age, Past/current experiences, and Time since onset of injury/illness/exacerbation are also affecting patient's functional outcome.   REHAB POTENTIAL: Excellent  CLINICAL DECISION MAKING: Evolving/moderate complexity  EVALUATION COMPLEXITY: Moderate   GOALS: Goals reviewed with patient? Yes  SHORT TERM GOALS: Target date: 01/31/24 Pt will be instructed on a HEP for R and L LE strengthening 4-5x/week Baseline: at next visit Goal status: INITIAL   LONG TERM GOALS: Target date: 03/11/23  Improve R SLR (hamstring flexibility >10 deg) to facilitate improved ability to perform daily activities without being limited by R hip pain Baseline: L 85, R 65 deg Goal status: INITIAL  2.  Improve R hip abdstrength >1/2 MMT grade to facilitate improved standing and walking tolerance without being limited by R hip pain Baseline: 3+/5 R hip abd Goal status: INITIAL  3.  Improve LEFS >10 points indicating pt significantly less limited in  her daily activities by R hip pain/weakness Baseline: 45/80 Goal status: INITIAL   PLAN:  PT FREQUENCY: 2x/week  PT DURATION: 8 weeks  PLANNED INTERVENTIONS: 97110-Therapeutic exercises,  97530- Therapeutic activity, [id number]- Neuromuscular re-education, 97535- Self Care, [id number]- Manual therapy, and Patient/Family education  PLAN FOR NEXT SESSION: continue with hip strengthening  Vernell Reges, PT, DPT, OCS   Vernell FORBES Reges, PT 02/11/2024, 12:32 PM

## 2024-02-14 ENCOUNTER — Ambulatory Visit

## 2024-02-16 ENCOUNTER — Ambulatory Visit

## 2024-02-20 NOTE — Progress Notes (Unsigned)
 "  Subjective:    Patient ID: Rhonda Lamb, female    DOB: 1945-07-29, 78 y.o.   MRN: [id number]  HPI  Patient presents to clinic today for 67-month follow-up of chronic conditions.  HTN: Her BP today is 130/60.  She is taking losartan  as prescribed. Her blood pressure is running about 160's systolic at home.  ECG from 11/2022 reviewed.  Osteoporosis: She is not taking calcium  and vit D OTC.  She has been on ibandronate  in the past. She tries to get weightbearing exercise.  Bone density from 12/2019 reviewed.  HLD: Her last LDL was 76, triglycerides 843, 08/2023.  She denies myalgias on atorvastatin .  She tries to consume low-fat diet.  Chronic cough: Silent reflux. She is taking omeprazole  and cetirizine  OTC. There is no upper GI on file.   Anxiety: Intermittent, managed on buspirone  as needed although she has not taken this recently because she does not feel like she needs this.  She is not currently seeing a therapist.  She denies depression, SI/HI.  OA: Mainly in her lower back and right hip pain. She did recently have an acetabular labrum tear s/p fall. She takes ibuprofen OTC as needed with some relief of symptoms. She is no longer taking meloxicam , methocarbamol as prescribed. MRI right hip from 12/2023 reviewed. Xray lumbar spine from 03/2023 reviewed. She follows with orthopedics.  Prediabetes: Her last A1c was 5.8%, 08/2023.  She is not taking any oral diabetic medication at this time.  She does not check her sugars.  Frequent headaches: These occur rarely. Managed with ibuprofen as needed with some relief of symptoms. She does not follow with neurology.  Review of Systems     Past Medical History:  Diagnosis Date   Motion sickness    circular motion   Vertigo    none in over 1 year    Current Outpatient Medications  Medication Sig Dispense Refill   acetaminophen (TYLENOL) 500 MG tablet Take 500 mg by mouth.     atorvastatin  (LIPITOR) 10 MG tablet TAKE 1 TABLET EVERY  DAY 90 tablet 3   busPIRone  (BUSPAR ) 5 MG tablet TAKE 1 TABLET EVERY DAY AS NEEDED (Patient not taking: Reported on 01/10/2024) 90 tablet 0   calcium -vitamin D  (OSCAL WITH D) 500-5 MG-MCG tablet Take 2 tablets by mouth daily with breakfast. 180 tablet 1   cetirizine  (ZYRTEC ) 10 MG tablet Take 1 tablet (10 mg total) by mouth daily. 90 tablet 1   losartan  (COZAAR ) 100 MG tablet Take 1 tablet (100 mg total) by mouth daily. 90 tablet 1   meclizine  (ANTIVERT ) 25 MG tablet TAKE 1 TABLET THREE TIMES DAILY AS NEEDED FOR DIZZINESS 30 tablet 1   meloxicam  (MOBIC ) 15 MG tablet Take 1 tablet (15 mg total) by mouth daily. 90 tablet 0   methocarbamol (ROBAXIN) 500 MG tablet Take 500 mg by mouth.     omeprazole  (PRILOSEC) 20 MG capsule Take 1 capsule (20 mg total) by mouth daily. 90 capsule 1   pantoprazole (PROTONIX) 40 MG tablet Take 40 mg by mouth daily.     Vitamin D , Ergocalciferol , (DRISDOL ) 1.25 MG (50000 UNIT) CAPS capsule Take 50,000 Units by mouth once a week.     No current facility-administered medications for this visit.    No Known Allergies  Family History  Problem Relation Age of Onset   Breast cancer Neg Hx     Social History   Socioeconomic History   Marital status: Married    Spouse name:  Not on file   Number of children: 3   Years of education: Not on file   Highest education level: Not on file  Occupational History   Occupation: Accounting  Tobacco Use   Smoking status: Never   Smokeless tobacco: Never  Vaping Use   Vaping status: Never Used  Substance and Sexual Activity   Alcohol use: Never   Drug use: Never   Sexual activity: Not on file  Other Topics Concern   Not on file  Social History Narrative   Not on file   Social Drivers of Health   Tobacco Use: Low Risk (01/10/2024)   Patient History    Smoking Tobacco Use: Never    Smokeless Tobacco Use: Never    Passive Exposure: Not on file  Financial Resource Strain: Low Risk (10/07/2023)   Overall Financial  Resource Strain (CARDIA)    Difficulty of Paying Living Expenses: Not hard at all  Food Insecurity: Low Risk (12/31/2023)   [facility]   Food Insecurity    Within the past 12 months, did the food you bought just not last and you didn't have money to get more?: No    Within the past 12 months, did you worry that your food would run out before you got money to buy more?: No  Transportation Needs: Low Risk (12/31/2023)   Received from [facility]   Transportation Needs    Within the past 12 months, has a lack of transportation kept you from medical appointments or from doing things needed for daily living?: No  Physical Activity: Inactive (10/07/2023)   Exercise Vital Sign    Days of Exercise per Week: 0 days    Minutes of Exercise per Session: 0 min  Stress: No Stress Concern Present (10/07/2023)   [employer] of Occupational Health - Occupational Stress Questionnaire    Feeling of Stress: Not at all  Social Connections: Socially Integrated (10/07/2023)   Social Connection and Isolation Panel    Frequency of Communication with Friends and Family: More than three times a week    Frequency of Social Gatherings with Friends and Family: More than three times a week    Attends Religious Services: More than 4 times per year    Active Member of [employer] or Organizations: Yes    Attends Banker Meetings: More than 4 times per year    Marital Status: Married  Catering Manager Violence: Not At Risk (10/07/2023)   Epic    Fear of Current or Ex-Partner: No    Emotionally Abused: No    Physically Abused: No    Sexually Abused: No  Depression (PHQ2-9): Low Risk (10/07/2023)   Depression (PHQ2-9)    PHQ-2 Score: 0  Alcohol Screen: Low Risk (10/07/2023)   Alcohol Screen    Last Alcohol Screening Score (AUDIT): 0  Housing: Unknown (10/07/2023)   Epic    Unable to Pay for Housing in the Last Year: No    Number of Times Moved in the Last Year: Not on  file    Homeless in the Last Year: No  Utilities: Low Risk (12/31/2023)   Received from [facility]   Utilities    Within the past 12 months, have you been unable to get utilities (heat, electricity) when it was really needed?: No  Health Literacy: Adequate Health Literacy (10/07/2023)   B1300 Health Literacy    Frequency of need for help with medical instructions: Never  Constitutional: Pt reports intermittent headaches. Denies fever, malaise, fatigue, headache or abrupt weight changes.  HEENT:  Denies eye pain, eye redness, ear pain, ringing in the ears, runny nose, nasal congestion, bloody nose, or sore throat. Respiratory: Patient reports intermittent cough.  Denies difficulty breathing, shortness of breath, or sputum production.   Cardiovascular: Denies chest pain, chest tightness, palpitations or swelling in the hands or feet.  Gastrointestinal: Denies abdominal pain, bloating, constipation, diarrhea or blood in the stool.  GU: Denies urgency, frequency, pain with urination, burning sensation, blood in urine, odor or discharge. Musculoskeletal: Patient reports low back pain.  Denies decrease in range of motion, difficulty with gait, muscle pain or joint swelling.  Skin: Denies redness, rashes, lesions or ulcercations.  Neurological: Denies difficulty with memory, difficulty with speech or problems with balance and coordination.  Psych: Patient has a history of anxiety.  Denies depression, SI/HI.  No other specific complaints in a complete review of systems (except as listed in HPI above).  Objective:   Physical Exam  BP (!) 162/98 (BP Location: Left Arm, Patient Position: Sitting, Cuff Size: Normal)   Pulse 77   Ht 5' 3.5 (1.613 m)   Wt 150 lb (68 kg)   SpO2 98%   BMI 26.15 kg/m      Wt Readings from Last 3 Encounters:  01/10/24 149 lb (67.6 kg)  10/07/23 149 lb (67.6 kg)  08/20/23 154 lb 6.4 oz (70 kg)    General: Appears her stated age,  overweight, in NAD. Skin: Warm, dry and intact.  HEENT: Head: normal shape and size; Eyes: sclera white, no icterus, conjunctiva pink, PERRLA and EOMs intact; Left Ear: cerumen impaction Cardiovascular: Normal rate and rhythm. S1,S2 noted.  No murmur, rubs or gallops noted. No JVD or BLE edema. No carotid bruits noted. Pulmonary/Chest: Normal effort and positive vesicular breath sounds. No respiratory distress. No wheezes, rales or ronchi noted.  Musculoskeletal: Bony tenderness noted over the lumbar spine.  No difficulty with gait.  Neurological: Alert and oriented. Coordination normal.  Psychiatric: Mood and affect normal.  Mildly anxious appearing.  Judgment and thought content normal.    BMET    Component Value Date/Time   NA 139 08/20/2023 0824   K 4.2 08/20/2023 0824   CL 105 08/20/2023 0824   CO2 26 08/20/2023 0824   GLUCOSE 94 08/20/2023 0824   BUN 12 08/20/2023 0824   CREATININE 0.83 08/20/2023 0824   CALCIUM  8.9 08/20/2023 0824   GFRNONAA 72 11/16/2019 1058   GFRAA 83 11/16/2019 1058    Lipid Panel     Component Value Date/Time   CHOL 167 08/20/2023 0824   TRIG 156 (H) 08/20/2023 0824   HDL 66 08/20/2023 0824   CHOLHDL 2.5 08/20/2023 0824   LDLCALC 76 08/20/2023 0824    CBC    Component Value Date/Time   WBC 4.7 08/20/2023 0824   RBC 5.03 08/20/2023 0824   HGB 15.0 08/20/2023 0824   HCT 46.8 (H) 08/20/2023 0824   PLT 187 08/20/2023 0824   MCV 93.0 08/20/2023 0824   MCH 29.8 08/20/2023 0824   MCHC 32.1 08/20/2023 0824   RDW 12.8 08/20/2023 0824   LYMPHSABS 1,946 05/21/2020 0913   EOSABS 108 05/21/2020 0913   BASOSABS 38 05/21/2020 0913    Hgb A1C Lab Results  Component Value Date   HGBA1C 5.8 (H) 08/20/2023          Assessment & Plan:     RTC in 2 weeks, follow-up HTN, 6  months, follow-up chronic conditions Angeline Laura, NP  "

## 2024-02-21 ENCOUNTER — Encounter: Payer: Self-pay | Admitting: Internal Medicine

## 2024-02-21 ENCOUNTER — Ambulatory Visit

## 2024-02-21 ENCOUNTER — Ambulatory Visit: Admitting: Internal Medicine

## 2024-02-21 ENCOUNTER — Other Ambulatory Visit: Payer: Self-pay | Admitting: Internal Medicine

## 2024-02-21 ENCOUNTER — Other Ambulatory Visit: Payer: Self-pay

## 2024-02-21 VITALS — BP 142/86 | HR 77 | Ht 63.5 in | Wt 150.0 lb

## 2024-02-21 DIAGNOSIS — E663 Overweight: Secondary | ICD-10-CM | POA: Diagnosis not present

## 2024-02-21 DIAGNOSIS — Z1231 Encounter for screening mammogram for malignant neoplasm of breast: Secondary | ICD-10-CM

## 2024-02-21 DIAGNOSIS — R053 Chronic cough: Secondary | ICD-10-CM

## 2024-02-21 DIAGNOSIS — F411 Generalized anxiety disorder: Secondary | ICD-10-CM | POA: Diagnosis not present

## 2024-02-21 DIAGNOSIS — E782 Mixed hyperlipidemia: Secondary | ICD-10-CM

## 2024-02-21 DIAGNOSIS — R29898 Other symptoms and signs involving the musculoskeletal system: Secondary | ICD-10-CM

## 2024-02-21 DIAGNOSIS — G8929 Other chronic pain: Secondary | ICD-10-CM | POA: Insufficient documentation

## 2024-02-21 DIAGNOSIS — M25551 Pain in right hip: Secondary | ICD-10-CM

## 2024-02-21 DIAGNOSIS — R7303 Prediabetes: Secondary | ICD-10-CM | POA: Diagnosis not present

## 2024-02-21 DIAGNOSIS — I10 Essential (primary) hypertension: Secondary | ICD-10-CM

## 2024-02-21 DIAGNOSIS — Z6826 Body mass index (BMI) 26.0-26.9, adult: Secondary | ICD-10-CM

## 2024-02-21 DIAGNOSIS — S73191D Other sprain of right hip, subsequent encounter: Secondary | ICD-10-CM

## 2024-02-21 DIAGNOSIS — M47816 Spondylosis without myelopathy or radiculopathy, lumbar region: Secondary | ICD-10-CM | POA: Diagnosis not present

## 2024-02-21 DIAGNOSIS — M810 Age-related osteoporosis without current pathological fracture: Secondary | ICD-10-CM | POA: Diagnosis not present

## 2024-02-21 MED ORDER — AMLODIPINE BESYLATE 5 MG PO TABS: 5.0000 mg | ORAL_TABLET | Freq: Every day | ORAL | 0 refills | Status: DC

## 2024-02-21 MED ORDER — MELOXICAM 15 MG PO TABS: 15.0000 mg | ORAL_TABLET | Freq: Every day | ORAL | 0 refills | Status: DC

## 2024-02-21 NOTE — Assessment & Plan Note (Signed)
 Controlled on omeprazole  20 mg daily and cetirizine  10 mg daily We will monitor

## 2024-02-21 NOTE — Assessment & Plan Note (Signed)
 C-Met and lipid profile today Encouraged to consume low-fat diet Continue atorvastatin  10 mg daily, will adjust if needed based on labs

## 2024-02-21 NOTE — Patient Instructions (Signed)

## 2024-02-21 NOTE — Assessment & Plan Note (Signed)
 Encouraged regular stretching and core strengthening Advised her to stop ibuprofen OTC as this is likely contributing to her elevated blood pressures Will start her back on meloxicam  15 mg daily She will continue to follow with orthopedics

## 2024-02-21 NOTE — Therapy (Signed)
 " OUTPATIENT PHYSICAL THERAPY LOWER EXTREMITY TREATMENT/Progress Note   Patient Name: Rhonda Lamb MRN: [id number] DOB:07-15-1945, 78 y.o., femalef Today's Date: 02/21/2024  END OF SESSION:  PT End of Session - 02/21/24 1122     Visit Number 10    Number of Visits 17    Date for Recertification  03/10/24    Authorization Type 2x/week x 8 weeks    PT Start Time 1120    PT Stop Time 1205    PT Time Calculation (min) 45 min                Past Medical History:  Diagnosis Date   Motion sickness    circular motion   Vertigo    none in over 1 year   Past Surgical History:  Procedure Laterality Date   COLONOSCOPY WITH PROPOFOL  N/A 06/06/2020   Procedure: COLONOSCOPY WITH PROPOFOL ;  Surgeon: Jinny Carmine, MD;  Location: [facility];  Service: Endoscopy;  Laterality: N/A;  priority 4   WISDOM TOOTH EXTRACTION     Patient Active Problem List   Diagnosis Date Noted   Chronic right hip pain 02/21/2024   Osteoarthritis 08/20/2023   Chronic cough 02/18/2023   Prediabetes 11/20/2022   GAD (generalized anxiety disorder) 02/04/2022   Overweight with body mass index (BMI) of 26 to 26.9 in adult 09/24/2020   Essential hypertension 06/18/2020   Osteoporosis 12/07/2019   Mixed hyperlipidemia 11/16/2019    PCP: Angeline Laura, NP  REFERRING PROVIDER: Belvie Roers, MD  REFERRING DIAG:  Diagnosis  S73.191D (ICD-10-CM) - Tear of right acetabular labrum, subsequent encounter  D23.687J (ICD-10-CM) - Partial tear of left hamstring  S76.311A (ICD-10-CM) - Partial tear of right hamstring    THERAPY DIAG:  Pain in right hip  Weakness of right hip  Tear of right acetabular labrum, subsequent encounter  Rationale for Evaluation and Treatment: Rehabilitation  ONSET DATE: 15 days ago  SUBJECTIVE:   SUBJECTIVE STATEMENT: Pt is here with her husband [name].  MOI: 12/30/23 fall on R outer hip when trying to get out of a hospital recliner bed while visiting her  brother at [facility].  Underwent full work up in [employer].  And was admitted to hospital for tx.  No fx.  Sustained R labral tear (anterior/superior) and hamstring strain/partial tear.  C/o R achy in glute max area, R goin area and runs down into inner thigh  Sx come and go in R hip/groin  Not back to her normal activities  Now able to put weight on R LE- was not initially  CLOF: getting in and out of the shower is concerning, not doing housework, not driving, has everything on first floor she needs- has a second floor of her home- able to do if she needs to   PLOF: was driving, was independent, spending time with family, [place] she [place], getting ready for holidays, gardening/landscaping/weeding, helping her brother- does a lot of physical activities to help him   PERTINENT HISTORY: Has concerns of high BP- trying to manage this since the injury too (medication side effect)   Overall, making progress.   PAIN:  Are you having pain? 2/10  PRECAUTIONS: Fall  RED FLAGS: None   WEIGHT BEARING RESTRICTIONS: No  FALLS:  Has patient fallen in last 6 months? Yes. Number of falls 1  LIVING ENVIRONMENT: Lives with: lives with their spouse Lives in: House/apartment Stairs: yes, few to enter and second story Has following equipment at home: Single  point cane and Walker - 2 wheeled but not using  OCCUPATION: not working  PLOF: Independent  PATIENT GOALS: to be able to manage the pain and build up the strength again in her hip to resume her PLOF   NEXT MD VISIT: unknown  OBJECTIVE:  Note: Objective measures were completed at Evaluation unless otherwise noted.  DIAGNOSTIC FINDINGS: full work up in E.R after injury MRI 12/30/23 IMPRESSION:  1.  No acute fracture.  2.  Soft tissue contusion in the subcutaneous fat of the right lateral proximal thigh.  3.  Moderate bilateral hamstring origin tendinosis with partial tears of the bilateral hamstring tendon origins,  high-grade partial on the left.  4.  Mild bilateral gluteal tendinosis.  5.  Complex tear of the right anterior superior and superior acetabular labrum with intermediate grade juxta labral chondrosis and subchondral cystic changes in the acetabulum. No substantial right hip joint effusion.  6.  Moderate left SI joint degenerative changes with mild subchondral marrow edema.   PATIENT SURVEYS:  LEFS: to be collected  COGNITION: Overall cognitive status: Within functional limits for tasks assessed     SENSATION: WFL   POSTURE:  Hamstring flexibility with SLR: R 65, R 85 deg  PALPATION:  TTP    LOWER EXTREMITY ROM:  Active ROM Right eval Left eval  Hip flexion 105 pain 110  Hip extension    Hip abduction 25 40  Hip adduction    Hip internal rotation 20 20  Hip external rotation 20 30  Knee flexion    Knee extension    Ankle dorsiflexion    Ankle plantarflexion    Ankle inversion    Ankle eversion     (Blank rows = not tested)  LOWER EXTREMITY MMT:  MMT Right eval Left eval  Hip flexion 3+/5 pain 5  Hip extension    Hip abduction 3+/5 pain 4  Hip adduction    Hip internal rotation 4 5  Hip external rotation 4 5  Knee flexion 4 4  Knee extension    Ankle dorsiflexion    Ankle plantarflexion    Ankle inversion    Ankle eversion     (Blank rows = not tested)  LOWER EXTREMITY SPECIAL TESTS:    FUNCTIONAL TESTS:  5 times sit to stand: 15 sec  GAIT: Distance walked: into clinic without AD, + antalgic gait on R noted                                                                                                                                TREATMENT DATE: 02/21/24  Subjective: Pt reports her hip is sore if she does a lot of standing activities at home, but she has been pacing herself and the pain has been more manageable.  Today she reports 1-2/10 R hip pain upon arrival.    Objective: For progress note- Updated goals- see below  Therapeutic  Exercise: Nustep x 7 min-  level 2, seat #10 Isometric hip ER with PT x10 in hooklying, 3 sets- not today Supine bridge x 10, 1 set R Clam shell x 10 sidelying , 3 sets R hip abd x 5 with PT tactile cues (manual assist for first set), 3 sets Quad set: x10, 2 sets- not today SAQ x 10, 3 sets- not today LAQ: 3x10- not today  Therapeutic Activities:  Hip abd:2x10 2# ankle weights (no weights today) Hip extension: 2x10 2# ankle weights (no weights today) Squats with elevated mat table behind her- PT tactile and verbal cues to go slower, 2x10 (used a chair with mirror) Front step up RLLR 6-inch x 10, 2 sets Stair ascent/descent 3 laps reciprocal pattern Alternating toe taps on 6 inch step x10, 2 sets Tandem stance on airex x 30 second intervals, 3x ea side SLS on airex: 3x 10 seconds Lateral stepping along parallel bar 4 laps Reviewed sitting posture/position- benefit of sitting with hips slightly higher than knees for comfort/pain control when sitting; reviewed importance of activity pacing/modification for sx control  Manual Therapy: not today STM R posterior hip mm- glute med/max/piriformis/hamstring, used TB green roller too Manual QL stretch 1 min x 2 Hip belt mob: lat distraction, inf glide- Gr II/III- not today  Ended with moist heat for pain control x 5 min today (unbilled)  PATIENT EDUCATION:  Education details: PT POC/goals, hip anatomy/function, role of labrum, discussed activity modification/pacing Person educated: Patient and Spouse Education method: Explanation Education comprehension: verbalized understanding and needs further education  HOME EXERCISE PROGRAM: Access Code: [email or web address] URL: [email or web address] Date: 01/24/2024 Prepared by: Vernell Reges  Exercises - Hooklying Isometric Hip External Rotation  - 2 x daily - 7 x weekly - 3 sets - 5 reps - Supine Hip Abduction  - 2 x daily - 7 x weekly - 3 sets - 5 reps - Supine Posterior Pelvic Tilt  -  2 x daily - 7 x weekly - 3 sets - 15 reps - Supine Bridge  - 2 x daily - 7 x weekly - 3 sets - 5 reps   ASSESSMENT:  CLINICAL IMPRESSION: Patient is a 78 y.o. F who was seen today for physical therapy treatment for R hip labral tear and proximal hamstring tendon strain.  She is making progress towards goals.  Pain is improving.  Recommend continued PT as she is still very limited in her standing/walking tolerance for activities at home an din the community at this point. Discussed benefit of continuing with PT for strengthening as this will facilitate optimal long term recovery.  Would benefit from continued skilled PT to address impairments and facilitate return to PLOF.  OBJECTIVE IMPAIRMENTS: Abnormal gait, decreased activity tolerance, decreased balance, decreased ROM, decreased strength, impaired perceived functional ability, and pain.   ACTIVITY LIMITATIONS: lifting, bending, sitting, standing, squatting, stairs, transfers, bathing, locomotion level, and caring for others  PARTICIPATION LIMITATIONS: meal prep, cleaning, laundry, driving, community activity, and yard work  PERSONAL FACTORS: Age, Past/current experiences, and Time since onset of injury/illness/exacerbation are also affecting patient's functional outcome.   REHAB POTENTIAL: Excellent  CLINICAL DECISION MAKING: Evolving/moderate complexity  EVALUATION COMPLEXITY: Moderate   GOALS: Goals reviewed with patient? Yes  SHORT TERM GOALS: Target date: 01/31/24 Pt will be instructed on a HEP for R and L LE strengthening 4-5x/week Baseline: initiated Goal status: Met   LONG TERM GOALS: Target date: 03/11/23  Improve R SLR (hamstring flexibility >10 deg) to facilitate improved ability to perform daily activities without being limited by  R hip pain Baseline: L 85, R 65 deg; 02/21/24 R 70 deg Goal status: In progress  2.  Improve R hip abdstrength >1/2 MMT grade to facilitate improved standing and walking tolerance without  being limited by R hip pain Baseline: 3+/5 R hip abd, 02/21/24 3+/5 Goal status: In progress  3.  Improve LEFS >10 points indicating pt significantly less limited in her daily activities by R hip pain/weakness Baseline: 45/80 Goal status: In progress   PLAN:  PT FREQUENCY: 2x/week  PT DURATION: 8 weeks  PLANNED INTERVENTIONS: 97110-Therapeutic exercises, 97530- Therapeutic activity, 97112- Neuromuscular re-education, 97535- Self Care, [id number]- Manual therapy, and Patient/Family education  PLAN FOR NEXT SESSION: continue with hip strengthening- emphasis on gluteal mm  Vernell Reges, PT, DPT, OCS   Vernell FORBES Reges, PT 02/21/2024, 11:23 AM  "

## 2024-02-21 NOTE — Assessment & Plan Note (Signed)
 A1c today Encourage low-carb diet and exercise for weight loss

## 2024-02-21 NOTE — Assessment & Plan Note (Signed)
 Encouraged diet and exercise for weight loss ?

## 2024-02-21 NOTE — Assessment & Plan Note (Signed)
 Remains elevated Continue losartan  100 mg daily Will add amlodipine  5 mg daily We will continue to monitor at this time Reinforced DASH diet and exercise for weight loss C-Met today

## 2024-02-21 NOTE — Assessment & Plan Note (Addendum)
 Status post acetabular tear after a fall 12/2023 Advised her to stop ibuprofen OTC as this is likely contributing to her elevated blood pressures Will start her back on meloxicam  15 mg daily She will continue to follow with orthopedics

## 2024-02-21 NOTE — Assessment & Plan Note (Signed)
 Will have her start taking calcium  and 1000 mg and vitamin D  400 units daily Encouraged daily weightbearing exercise Bone density ordered-she will call to schedule

## 2024-02-21 NOTE — Assessment & Plan Note (Signed)
 She has buspirone  5 mg to take daily as needed Support offered

## 2024-02-22 ENCOUNTER — Ambulatory Visit: Payer: Self-pay | Admitting: Internal Medicine

## 2024-02-22 LAB — LIPID PANEL
Cholesterol: 152 mg/dL
HDL: 58 mg/dL
LDL Cholesterol (Calc): 70 mg/dL
Non-HDL Cholesterol (Calc): 94 mg/dL
Total CHOL/HDL Ratio: 2.6 (calc)
Triglycerides: 162 mg/dL — ABNORMAL HIGH

## 2024-02-22 LAB — CBC
HCT: 41.2 % (ref 35.9–46.0)
Hemoglobin: 13.6 g/dL (ref 11.7–15.5)
MCH: 30.6 pg (ref 27.0–33.0)
MCHC: 33 g/dL (ref 31.6–35.4)
MCV: 92.6 fL (ref 81.4–101.7)
MPV: 11.2 fL (ref 7.5–12.5)
Platelets: 211 Thousand/uL (ref 140–400)
RBC: 4.45 Million/uL (ref 3.80–5.10)
RDW: 12.5 % (ref 11.0–15.0)
WBC: 4.6 Thousand/uL (ref 3.8–10.8)

## 2024-02-22 LAB — COMPREHENSIVE METABOLIC PANEL WITH GFR
AG Ratio: 1.7 (calc) (ref 1.0–2.5)
ALT: 8 U/L (ref 6–29)
AST: 13 U/L (ref 10–35)
Albumin: 4.1 g/dL (ref 3.6–5.1)
Alkaline phosphatase (APISO): 71 U/L (ref 37–153)
BUN: 16 mg/dL (ref 7–25)
CO2: 28 mmol/L (ref 20–32)
Calcium: 8.8 mg/dL (ref 8.6–10.4)
Chloride: 106 mmol/L (ref 98–110)
Creat: 0.96 mg/dL (ref 0.60–1.00)
Globulin: 2.4 g/dL (ref 1.9–3.7)
Glucose, Bld: 90 mg/dL (ref 65–99)
Potassium: 3.9 mmol/L (ref 3.5–5.3)
Sodium: 140 mmol/L (ref 135–146)
Total Bilirubin: 0.5 mg/dL (ref 0.2–1.2)
Total Protein: 6.5 g/dL (ref 6.1–8.1)
eGFR: 61 mL/min/1.73m2

## 2024-02-22 LAB — HEMOGLOBIN A1C
Hgb A1c MFr Bld: 5.7 % — ABNORMAL HIGH
Mean Plasma Glucose: 117 mg/dL
eAG (mmol/L): 6.5 mmol/L

## 2024-03-01 ENCOUNTER — Ambulatory Visit

## 2024-03-01 DIAGNOSIS — M25551 Pain in right hip: Secondary | ICD-10-CM | POA: Diagnosis not present

## 2024-03-01 DIAGNOSIS — R29898 Other symptoms and signs involving the musculoskeletal system: Secondary | ICD-10-CM

## 2024-03-01 DIAGNOSIS — S73191D Other sprain of right hip, subsequent encounter: Secondary | ICD-10-CM

## 2024-03-01 NOTE — Therapy (Signed)
 " OUTPATIENT PHYSICAL THERAPY LOWER EXTREMITY TREATMENT   Patient Name: Rhonda Lamb MRN: [id number] DOB:1945/06/13, 78 y.o., female Today's Date: 03/01/2024  END OF SESSION:  PT End of Session - 03/01/24 1352     Visit Number 11    Number of Visits 17    Date for Recertification  03/10/24    Authorization Type 2x/week x 8 weeks    PT Start Time 1030    PT Stop Time 1125    PT Time Calculation (min) 55 min    Activity Tolerance Patient tolerated treatment well    Behavior During Therapy WFL for tasks assessed/performed                 Past Medical History:  Diagnosis Date   Motion sickness    circular motion   Vertigo    none in over 1 year   Past Surgical History:  Procedure Laterality Date   COLONOSCOPY WITH PROPOFOL  N/A 06/06/2020   Procedure: COLONOSCOPY WITH PROPOFOL ;  Surgeon: Jinny Carmine, MD;  Location: [facility];  Service: Endoscopy;  Laterality: N/A;  priority 4   WISDOM TOOTH EXTRACTION     Patient Active Problem List   Diagnosis Date Noted   Chronic right hip pain 02/21/2024   Osteoarthritis 08/20/2023   Chronic cough 02/18/2023   Prediabetes 11/20/2022   GAD (generalized anxiety disorder) 02/04/2022   Overweight with body mass index (BMI) of 26 to 26.9 in adult 09/24/2020   Essential hypertension 06/18/2020   Osteoporosis 12/07/2019   Mixed hyperlipidemia 11/16/2019    PCP: Angeline Laura, NP  REFERRING PROVIDER: Belvie Roers, MD  REFERRING DIAG:  Diagnosis  S73.191D (ICD-10-CM) - Tear of right acetabular labrum, subsequent encounter  D23.687J (ICD-10-CM) - Partial tear of left hamstring  S76.311A (ICD-10-CM) - Partial tear of right hamstring    THERAPY DIAG:  Pain in right hip  Weakness of right hip  Tear of right acetabular labrum, subsequent encounter  Rationale for Evaluation and Treatment: Rehabilitation  ONSET DATE: 15 days ago  SUBJECTIVE:   SUBJECTIVE STATEMENT: Pt is here with her husband [name].  MOI:  12/30/23 fall on R outer hip when trying to get out of a hospital recliner bed while visiting her brother at [facility].  Underwent full work up in [employer].  And was admitted to hospital for tx.  No fx.  Sustained R labral tear (anterior/superior) and hamstring strain/partial tear.  C/o R achy in glute max area, R goin area and runs down into inner thigh  Sx come and go in R hip/groin  Not back to her normal activities  Now able to put weight on R LE- was not initially  CLOF: getting in and out of the shower is concerning, not doing housework, not driving, has everything on first floor she needs- has a second floor of her home- able to do if she needs to   PLOF: was driving, was independent, spending time with family, [place] she [place], getting ready for holidays, gardening/landscaping/weeding, helping her brother- does a lot of physical activities to help him   PERTINENT HISTORY: Has concerns of high BP- trying to manage this since the injury too (medication side effect)   Overall, making progress.   PAIN:  Are you having pain? 2/10  PRECAUTIONS: Fall  RED FLAGS: None   WEIGHT BEARING RESTRICTIONS: No  FALLS:  Has patient fallen in last 6 months? Yes. Number of falls 1  LIVING ENVIRONMENT: Lives with: lives with  their spouse Lives in: House/apartment Stairs: yes, few to enter and second story Has following equipment at home: Single point cane and Walker - 2 wheeled but not using  OCCUPATION: not working  PLOF: Independent  PATIENT GOALS: to be able to manage the pain and build up the strength again in her hip to resume her PLOF   NEXT MD VISIT: unknown  OBJECTIVE:  Note: Objective measures were completed at Evaluation unless otherwise noted.  DIAGNOSTIC FINDINGS: full work up in E.R after injury MRI 12/30/23 IMPRESSION:  1.  No acute fracture.  2.  Soft tissue contusion in the subcutaneous fat of the right lateral proximal thigh.  3.  Moderate bilateral  hamstring origin tendinosis with partial tears of the bilateral hamstring tendon origins, high-grade partial on the left.  4.  Mild bilateral gluteal tendinosis.  5.  Complex tear of the right anterior superior and superior acetabular labrum with intermediate grade juxta labral chondrosis and subchondral cystic changes in the acetabulum. No substantial right hip joint effusion.  6.  Moderate left SI joint degenerative changes with mild subchondral marrow edema.   PATIENT SURVEYS:  LEFS: to be collected  COGNITION: Overall cognitive status: Within functional limits for tasks assessed     SENSATION: WFL   POSTURE:  Hamstring flexibility with SLR: R 65, R 85 deg  PALPATION:  TTP    LOWER EXTREMITY ROM:  Active ROM Right eval Left eval  Hip flexion 105 pain 110  Hip extension    Hip abduction 25 40  Hip adduction    Hip internal rotation 20 20  Hip external rotation 20 30  Knee flexion    Knee extension    Ankle dorsiflexion    Ankle plantarflexion    Ankle inversion    Ankle eversion     (Blank rows = not tested)  LOWER EXTREMITY MMT:  MMT Right eval Left eval  Hip flexion 3+/5 pain 5  Hip extension    Hip abduction 3+/5 pain 4  Hip adduction    Hip internal rotation 4 5  Hip external rotation 4 5  Knee flexion 4 4  Knee extension    Ankle dorsiflexion    Ankle plantarflexion    Ankle inversion    Ankle eversion     (Blank rows = not tested)  LOWER EXTREMITY SPECIAL TESTS:    FUNCTIONAL TESTS:  5 times sit to stand: 15 sec  GAIT: Distance walked: into clinic without AD, + antalgic gait on R noted                                                                                                                                TREATMENT DATE: 03/01/24  Subjective: Pt reports her hip is feeling better.  Physically and mentally she is feeling better about her injury and the recovery.  She has been tolerating more standing activity at her home this week.  Today she reports 1-2/10  R hip pain upon arrival.    Objective: For progress note- Updated goals- see below  Therapeutic Exercise: Nustep x 8 min- level 3, seat #10 (increased resistance and time) R Clam shell x 10 sidelying , 3 sets not today R hip abd x 5 with PT tactile cues (manual assist for first set), 3 sets not today Quad set: x10, 2 sets- not today SAQ x 10, 3 sets- not today LAQ: 3x10  Therapeutic Activities:  Hip abd:2x10 2# ankle weights  Hip extension: 2x10 2# ankle weights  Squats with elevated mat table behind her- holding 5# weight  2x12(progressed resistance today) Front step up RLLR 6-inch x 10, 2 sets Lateral step up R: 2x10 Stair ascent/descent 3 laps reciprocal pattern SLS on airex: 3x 10 seconds Lateral stepping along parallel bar 4 laps- not today Alternating marches: 2x15 ea Reviewed sitting posture/position- benefit of sitting with hips slightly higher than knees for comfort/pain control when sitting; reviewed importance of activity pacing/modification for sx control  Manual Therapy: not today STM R posterior hip mm- glute med/max/piriformis/hamstring, used TB green roller too Manual QL stretch 1 min x 2 Hip belt mob: lat distraction, inf glide- Gr II/III- not today  Ended with moist heat for pain control x 10 min today (unbilled)  PATIENT EDUCATION:  Education details: PT POC/goals, hip anatomy/function, role of labrum, discussed activity modification/pacing Person educated: Patient and Spouse Education method: Explanation Education comprehension: verbalized understanding and needs further education  HOME EXERCISE PROGRAM: Access Code: [email or web address] URL: [email or web address] Date: 01/24/2024 Prepared by: Vernell Reges  Exercises - Hooklying Isometric Hip External Rotation  - 2 x daily - 7 x weekly - 3 sets - 5 reps - Supine Hip Abduction  - 2 x daily - 7 x weekly - 3 sets - 5 reps - Supine Posterior Pelvic Tilt  - 2 x daily - 7 x  weekly - 3 sets - 15 reps - Supine Bridge  - 2 x daily - 7 x weekly - 3 sets - 5 reps   ASSESSMENT:  CLINICAL IMPRESSION: Patient is a 78 y.o. F who was seen today for physical therapy treatment for R hip labral tear and proximal hamstring tendon strain.  She is making progress towards goals.  Pain is improving.  Able to tolerate progression of LE strengthening with increased resistance/reps/difficulty level today and she reports muscle soreness/fatigue at end but no increase in hip/groin pain.  She is also reporting improve activity tolerance at home during her housework. Recommend continued PT as she is still limited in her standing/walking tolerance for activities at home and the community while grocery shopping.  Discussed benefit of continuing with PT for strengthening as this will facilitate optimal long term recovery.  Would benefit from continued skilled PT to address impairments and facilitate return to PLOF.  OBJECTIVE IMPAIRMENTS: Abnormal gait, decreased activity tolerance, decreased balance, decreased ROM, decreased strength, impaired perceived functional ability, and pain.   ACTIVITY LIMITATIONS: lifting, bending, sitting, standing, squatting, stairs, transfers, bathing, locomotion level, and caring for others  PARTICIPATION LIMITATIONS: meal prep, cleaning, laundry, driving, community activity, and yard work  PERSONAL FACTORS: Age, Past/current experiences, and Time since onset of injury/illness/exacerbation are also affecting patient's functional outcome.   REHAB POTENTIAL: Excellent  CLINICAL DECISION MAKING: Evolving/moderate complexity  EVALUATION COMPLEXITY: Moderate   GOALS: Goals reviewed with patient? Yes  SHORT TERM GOALS: Target date: 01/31/24 Pt will be instructed on a HEP for R and L LE strengthening 4-5x/week Baseline: initiated Goal status: Met  LONG TERM GOALS: Target date: 03/11/23  Improve R SLR (hamstring flexibility >10 deg) to facilitate improved  ability to perform daily activities without being limited by R hip pain Baseline: L 85, R 65 deg; 02/21/24 R 70 deg Goal status: In progress  2.  Improve R hip abdstrength >1/2 MMT grade to facilitate improved standing and walking tolerance without being limited by R hip pain Baseline: 3+/5 R hip abd, 02/21/24 3+/5 Goal status: In progress  3.  Improve LEFS >10 points indicating pt significantly less limited in her daily activities by R hip pain/weakness Baseline: 45/80 Goal status: In progress   PLAN:  PT FREQUENCY: 2x/week  PT DURATION: 8 weeks  PLANNED INTERVENTIONS: 97110-Therapeutic exercises, 97530- Therapeutic activity, [id number]- Neuromuscular re-education, 97535- Self Care, [id number]- Manual therapy, and Patient/Family education  PLAN FOR NEXT SESSION: continue with hip strengthening- emphasis on gluteal mm.  Progress lateral step up/downs.  Vernell Reges, PT, DPT, OCS   Vernell FORBES Reges, PT 03/01/2024, 2:09 PM  "

## 2024-03-03 ENCOUNTER — Ambulatory Visit: Attending: Physical Medicine and Rehabilitation

## 2024-03-03 DIAGNOSIS — R29898 Other symptoms and signs involving the musculoskeletal system: Secondary | ICD-10-CM | POA: Diagnosis present

## 2024-03-03 DIAGNOSIS — M25551 Pain in right hip: Secondary | ICD-10-CM | POA: Insufficient documentation

## 2024-03-03 DIAGNOSIS — S73191D Other sprain of right hip, subsequent encounter: Secondary | ICD-10-CM | POA: Insufficient documentation

## 2024-03-03 NOTE — Therapy (Signed)
 " OUTPATIENT PHYSICAL THERAPY LOWER EXTREMITY TREATMENT   Patient Name: Rhonda Lamb MRN: [id number] DOB:12-28-1945, 79 y.o., female Today's Date: 03/03/2024  END OF SESSION:  PT End of Session - 03/03/24 1101     Visit Number 12    Number of Visits 17    Date for Recertification  03/10/24    Authorization Type 2x/week x 8 weeks    PT Start Time 1015    PT Stop Time 1105    PT Time Calculation (min) 50 min    Activity Tolerance Patient tolerated treatment well    Behavior During Therapy WFL for tasks assessed/performed                  Past Medical History:  Diagnosis Date   Motion sickness    circular motion   Vertigo    none in over 1 year   Past Surgical History:  Procedure Laterality Date   COLONOSCOPY WITH PROPOFOL  N/A 06/06/2020   Procedure: COLONOSCOPY WITH PROPOFOL ;  Surgeon: Jinny Carmine, MD;  Location: [facility];  Service: Endoscopy;  Laterality: N/A;  priority 4   WISDOM TOOTH EXTRACTION     Patient Active Problem List   Diagnosis Date Noted   Chronic right hip pain 02/21/2024   Osteoarthritis 08/20/2023   Chronic cough 02/18/2023   Prediabetes 11/20/2022   GAD (generalized anxiety disorder) 02/04/2022   Overweight with body mass index (BMI) of 26 to 26.9 in adult 09/24/2020   Essential hypertension 06/18/2020   Osteoporosis 12/07/2019   Mixed hyperlipidemia 11/16/2019    PCP: Angeline Laura, NP  REFERRING PROVIDER: Belvie Roers, MD  REFERRING DIAG:  Diagnosis  S73.191D (ICD-10-CM) - Tear of right acetabular labrum, subsequent encounter  D23.687J (ICD-10-CM) - Partial tear of left hamstring  S76.311A (ICD-10-CM) - Partial tear of right hamstring    THERAPY DIAG:  Pain in right hip  Weakness of right hip  Tear of right acetabular labrum, subsequent encounter  Rationale for Evaluation and Treatment: Rehabilitation  ONSET DATE: 15 days ago  SUBJECTIVE:   SUBJECTIVE STATEMENT: Pt is here with her husband Rhonda Lamb.  MOI:  12/30/23 fall on R outer hip when trying to get out of a hospital recliner bed while visiting her brother at [facility].  Underwent full work up in [employer].  And was admitted to hospital for tx.  No fx.  Sustained R labral tear (anterior/superior) and hamstring strain/partial tear.  C/o R achy in glute max area, R goin area and runs down into inner thigh  Sx come and go in R hip/groin  Not back to her normal activities  Now able to put weight on R LE- was not initially  CLOF: getting in and out of the shower is concerning, not doing housework, not driving, has everything on first floor she needs- has a second floor of her home- able to do if she needs to   PLOF: was driving, was independent, spending time with family, [place] she [place], getting ready for holidays, gardening/landscaping/weeding, helping her brother- does a lot of physical activities to help him   PERTINENT HISTORY: Has concerns of high BP- trying to manage this since the injury too (medication side effect)   Overall, making progress.   PAIN:  Are you having pain? 2/10  PRECAUTIONS: Fall  RED FLAGS: None   WEIGHT BEARING RESTRICTIONS: No  FALLS:  Has patient fallen in last 6 months? Yes. Number of falls 1  LIVING ENVIRONMENT: Lives with: lives  with their spouse Lives in: House/apartment Stairs: yes, few to enter and second story Has following equipment at home: Single point cane and Walker - 2 wheeled but not using  OCCUPATION: not working  PLOF: Independent  PATIENT GOALS: to be able to manage the pain and build up the strength again in her hip to resume her PLOF   NEXT MD VISIT: unknown  OBJECTIVE:  Note: Objective measures were completed at Evaluation unless otherwise noted.  DIAGNOSTIC FINDINGS: full work up in E.R after injury MRI 12/30/23 IMPRESSION:  1.  No acute fracture.  2.  Soft tissue contusion in the subcutaneous fat of the right lateral proximal thigh.  3.  Moderate bilateral  hamstring origin tendinosis with partial tears of the bilateral hamstring tendon origins, high-grade partial on the left.  4.  Mild bilateral gluteal tendinosis.  5.  Complex tear of the right anterior superior and superior acetabular labrum with intermediate grade juxta labral chondrosis and subchondral cystic changes in the acetabulum. No substantial right hip joint effusion.  6.  Moderate left SI joint degenerative changes with mild subchondral marrow edema.   PATIENT SURVEYS:  LEFS: to be collected  COGNITION: Overall cognitive status: Within functional limits for tasks assessed     SENSATION: WFL   POSTURE:  Hamstring flexibility with SLR: R 65, R 85 deg  PALPATION:  TTP    LOWER EXTREMITY ROM:  Active ROM Right eval Left eval  Hip flexion 105 pain 110  Hip extension    Hip abduction 25 40  Hip adduction    Hip internal rotation 20 20  Hip external rotation 20 30  Knee flexion    Knee extension    Ankle dorsiflexion    Ankle plantarflexion    Ankle inversion    Ankle eversion     (Blank rows = not tested)  LOWER EXTREMITY MMT:  MMT Right eval Left eval  Hip flexion 3+/5 pain 5  Hip extension    Hip abduction 3+/5 pain 4  Hip adduction    Hip internal rotation 4 5  Hip external rotation 4 5  Knee flexion 4 4  Knee extension    Ankle dorsiflexion    Ankle plantarflexion    Ankle inversion    Ankle eversion     (Blank rows = not tested)  LOWER EXTREMITY SPECIAL TESTS:    FUNCTIONAL TESTS:  5 times sit to stand: 15 sec  GAIT: Distance walked: into clinic without AD, + antalgic gait on R noted                                                                                                                                TREATMENT DATE: 03/03/24  Subjective: Pt reports her hip is feeling better.  Physically and mentally she is feeling better about her injury and the recovery.  She has been tolerating more standing activity at her home this week.   Would like  to plan to begin her walking program after she completes PT- she used to walk at the [facility] about 6 months ago.  Today she reports 0/10 R hip pain upon arrival.     Objective:  Therapeutic Exercise: Nustep x 8 min- level 4, seat #10 (increased resistance) R Clam shell x 10 sidelying , 3 sets not today R hip abd x 5 with PT tactile cues (manual assist for first set), 3 sets not today MRE: isometric abd in hooklying x 10 MRE: isometric add inhooklying x10 PROM R hip: to assess today, pain free for all flexion, ER, IR motions today.    Therapeutic Activities:  Hip abd:2x12, each LE 2# ankle weights  Hip extension: 2x10 2# ankle weights  Squats with elevated mat table behind her- holding 5# weight  2x12(progressed resistance today) Front step up RLLR 6-inch x 10, 2 sets Lateral step up R: 2x10 Stair ascent/descent 3 laps reciprocal pattern- not today SLS on airex: 3x 10 seconds Lateral stepping along parallel bar 4 laps, blue band around thighs- not today Alternating marches: 2x15 ea- forward progression in // bars    Manual Therapy: not today STM R posterior hip mm- glute med/max/piriformis/hamstring, used TB green roller too Manual QL stretch 1 min x 2 Hip belt mob: lat distraction, inf glide- Gr II/III- not today  Ended with moist heat for pain control x 10 min today (unbilled)  PATIENT EDUCATION:  Education details: PT POC/goals, hip anatomy/function, role of labrum, discussed activity modification/pacing Person educated: Patient and Spouse Education method: Explanation Education comprehension: verbalized understanding and needs further education  HOME EXERCISE PROGRAM: Access Code: [email or web address] URL: [email or web address] Date: 01/24/2024 Prepared by: Vernell Reges  Exercises - Hooklying Isometric Hip External Rotation  - 2 x daily - 7 x weekly - 3 sets - 5 reps - Supine Hip Abduction  - 2 x daily - 7 x weekly - 3 sets - 5 reps - Supine  Posterior Pelvic Tilt  - 2 x daily - 7 x weekly - 3 sets - 15 reps - Supine Bridge  - 2 x daily - 7 x weekly - 3 sets - 5 reps   ASSESSMENT:  CLINICAL IMPRESSION: Patient is a 79 y.o. F who was seen today for physical therapy treatment for R hip labral tear and proximal hamstring tendon strain.  She is making progress towards goals.  Pain is improving. R hip PROM is painfree today.  She is doing well with strengthening exercises; challenged but no increase in joint pain with gluteal mm exercises today. She is also reporting improve activity tolerance at home during her housework.  Recommend continued PT as she is still limited in her standing/walking tolerance for activities at home and the community while grocery shopping.  Discussed benefit of continuing with PT for strengthening as this will facilitate optimal long term recovery.  Would benefit from continued skilled PT to address impairments and facilitate return to PLOF.  OBJECTIVE IMPAIRMENTS: Abnormal gait, decreased activity tolerance, decreased balance, decreased ROM, decreased strength, impaired perceived functional ability, and pain.   ACTIVITY LIMITATIONS: lifting, bending, sitting, standing, squatting, stairs, transfers, bathing, locomotion level, and caring for others  PARTICIPATION LIMITATIONS: meal prep, cleaning, laundry, driving, community activity, and yard work  PERSONAL FACTORS: Age, Past/current experiences, and Time since onset of injury/illness/exacerbation are also affecting patient's functional outcome.   REHAB POTENTIAL: Excellent  CLINICAL DECISION MAKING: Evolving/moderate complexity  EVALUATION COMPLEXITY: Moderate   GOALS: Goals reviewed with patient? Yes  SHORT TERM  GOALS: Target date: 01/31/24 Pt will be instructed on a HEP for R and L LE strengthening 4-5x/week Baseline: initiated Goal status: Met   LONG TERM GOALS: Target date: 03/11/23  Improve R SLR (hamstring flexibility >10 deg) to facilitate  improved ability to perform daily activities without being limited by R hip pain Baseline: L 85, R 65 deg; 02/21/24 R 70 deg Goal status: In progress  2.  Improve R hip abdstrength >1/2 MMT grade to facilitate improved standing and walking tolerance without being limited by R hip pain Baseline: 3+/5 R hip abd, 02/21/24 3+/5 Goal status: In progress  3.  Improve LEFS >10 points indicating pt significantly less limited in her daily activities by R hip pain/weakness Baseline: 45/80 Goal status: In progress   PLAN:  PT FREQUENCY: 2x/week  PT DURATION: 8 weeks  PLANNED INTERVENTIONS: 97110-Therapeutic exercises, 97530- Therapeutic activity, [id number]- Neuromuscular re-education, 97535- Self Care, [id number]- Manual therapy, and Patient/Family education  PLAN FOR NEXT SESSION: continue with hip strengthening- emphasis on gluteal mm.  Progress lateral step up/downs.  Next week discuss how to begin/progress with a regular walking program for exercise again.  Vernell Reges, PT, DPT, OCS   Vernell FORBES Reges, PT 03/03/2024, 11:05 AM  "

## 2024-03-08 ENCOUNTER — Ambulatory Visit

## 2024-03-08 DIAGNOSIS — M25551 Pain in right hip: Secondary | ICD-10-CM | POA: Diagnosis not present

## 2024-03-08 DIAGNOSIS — S73191D Other sprain of right hip, subsequent encounter: Secondary | ICD-10-CM

## 2024-03-08 DIAGNOSIS — R29898 Other symptoms and signs involving the musculoskeletal system: Secondary | ICD-10-CM

## 2024-03-08 NOTE — Therapy (Signed)
 " OUTPATIENT PHYSICAL THERAPY LOWER EXTREMITY TREATMENT   Patient Name: Rhonda Lamb MRN: [id number] DOB:Feb 24, 1946, 79 y.o., female Today's Date: 03/08/2024  END OF SESSION:  PT End of Session - 03/08/24 1128     Visit Number 13    Number of Visits 17    Date for Recertification  03/10/24    Authorization Type 2x/week x 8 weeks    PT Start Time 1025    PT Stop Time 1055    PT Time Calculation (min) 30 min    Activity Tolerance Patient limited by fatigue                   Past Medical History:  Diagnosis Date   Motion sickness    circular motion   Vertigo    none in over 1 year   Past Surgical History:  Procedure Laterality Date   COLONOSCOPY WITH PROPOFOL  N/A 06/06/2020   Procedure: COLONOSCOPY WITH PROPOFOL ;  Surgeon: Rhonda Carmine, MD;  Location: [facility];  Service: Endoscopy;  Laterality: N/A;  priority 4   WISDOM TOOTH EXTRACTION     Patient Active Problem List   Diagnosis Date Noted   Chronic right hip pain 02/21/2024   Osteoarthritis 08/20/2023   Chronic cough 02/18/2023   Prediabetes 11/20/2022   GAD (generalized anxiety disorder) 02/04/2022   Overweight with body mass index (BMI) of 26 to 26.9 in adult 09/24/2020   Essential hypertension 06/18/2020   Osteoporosis 12/07/2019   Mixed hyperlipidemia 11/16/2019    PCP: Rhonda Laura, NP  REFERRING PROVIDER: Belvie Roers, MD  REFERRING DIAG:  Diagnosis  S73.191D (ICD-10-CM) - Tear of right acetabular labrum, subsequent encounter  D23.687J (ICD-10-CM) - Partial tear of left hamstring  S76.311A (ICD-10-CM) - Partial tear of right hamstring    THERAPY DIAG:  Pain in right hip  Weakness of right hip  Tear of right acetabular labrum, subsequent encounter  Rationale for Evaluation and Treatment: Rehabilitation  ONSET DATE: 15 days ago  SUBJECTIVE:   SUBJECTIVE STATEMENT: Pt is here with her husband Rhonda Lamb.  MOI: 12/30/23 fall on R outer hip when trying to get out of a  hospital recliner bed while visiting her brother at [facility].  Underwent full work up in [employer].  And was admitted to hospital for tx.  No fx.  Sustained R labral tear (anterior/superior) and hamstring strain/partial tear.  C/o R achy in glute max area, R goin area and runs down into inner thigh  Sx come and go in R hip/groin  Not back to her normal activities  Now able to put weight on R LE- was not initially  CLOF: getting in and out of the shower is concerning, not doing housework, not driving, has everything on first floor she needs- has a second floor of her home- able to do if she needs to   PLOF: was driving, was independent, spending time with family, [place], getting ready for holidays, gardening/landscaping/weeding, helping her brother- does a lot of physical activities to help him   PERTINENT HISTORY: Has concerns of high BP- trying to manage this since the injury too (medication side effect)   Overall, making progress.   PAIN:  Are you having pain? 2/10  PRECAUTIONS: Fall  RED FLAGS: None   WEIGHT BEARING RESTRICTIONS: No  FALLS:  Has patient fallen in last 6 months? Yes. Number of falls 1  LIVING ENVIRONMENT: Lives with: lives with their spouse Lives in: House/apartment Stairs: yes, few  to enter and second story Has following equipment at home: Single point cane and Walker - 2 wheeled but not using  OCCUPATION: not working  PLOF: Independent  PATIENT GOALS: to be able to manage the pain and build up the strength again in her hip to resume her PLOF   NEXT MD VISIT: unknown  OBJECTIVE:  Note: Objective measures were completed at Evaluation unless otherwise noted.  DIAGNOSTIC FINDINGS: full work up in E.R after injury MRI 12/30/23 IMPRESSION:  1.  No acute fracture.  2.  Soft tissue contusion in the subcutaneous fat of the right lateral proximal thigh.  3.  Moderate bilateral hamstring origin tendinosis with partial tears of the  bilateral hamstring tendon origins, high-grade partial on the left.  4.  Mild bilateral gluteal tendinosis.  5.  Complex tear of the right anterior superior and superior acetabular labrum with intermediate grade juxta labral chondrosis and subchondral cystic changes in the acetabulum. No substantial right hip joint effusion.  6.  Moderate left SI joint degenerative changes with mild subchondral marrow edema.   PATIENT SURVEYS:  LEFS: to be collected  COGNITION: Overall cognitive status: Within functional limits for tasks assessed     SENSATION: WFL   POSTURE:  Hamstring flexibility with SLR: R 65, R 85 deg  PALPATION:  TTP    LOWER EXTREMITY ROM:  Active ROM Right eval Left eval  Hip flexion 105 pain 110  Hip extension    Hip abduction 25 40  Hip adduction    Hip internal rotation 20 20  Hip external rotation 20 30  Knee flexion    Knee extension    Ankle dorsiflexion    Ankle plantarflexion    Ankle inversion    Ankle eversion     (Blank rows = not tested)  LOWER EXTREMITY MMT:  MMT Right eval Left eval  Hip flexion 3+/5 pain 5  Hip extension    Hip abduction 3+/5 pain 4  Hip adduction    Hip internal rotation 4 5  Hip external rotation 4 5  Knee flexion 4 4  Knee extension    Ankle dorsiflexion    Ankle plantarflexion    Ankle inversion    Ankle eversion     (Blank rows = not tested)  LOWER EXTREMITY SPECIAL TESTS:    FUNCTIONAL TESTS:  5 times sit to stand: 15 sec  GAIT: Distance walked: into clinic without AD, + antalgic gait on R noted                                                                                                                               TREATMENT DATE: 03/08/24  Subjective: Pt did some raking leaves in her yard yesterday.  This was the first time she has attempted outdoor work since her injury.  Felt fatigued and a little sore after, but no worse today.  Today she reports 0/10 R hip pain upon arrival.  BP: 122/73  today (sitting, L UE) O2 Sat: 99, HR 71 (checked in sitting)   Objective: Therapeutic Exercise: Nustep x 5 min- level 4, seat #10 (for hip ROM and strength) R hip abd x 5 with PT tactile cues (manual assist for first set), 3 sets (not today) MRE: isometric abd in hooklying x 10 (sitting today) MRE: isometric add in hooklying x10 (sitting today)  PROM R hip: to assess today, pain free for all flexion, ER, IR motions today.  Not today.   Seated heel slides (hamstring curl): 1x15, 1x10 (GTB)  Therapeutic Activities:  Hip abd:2x12, each LE 2# ankle weights (not today) Hip extension: 2x10 2# ankle weights (not today) Squats with elevated mat table behind her- holding 5# weight  2x12(progressed resistance today) (NT) Front step up RLLR 6-inch x 10, 2 sets (NT) Lateral step up R: 2x10 Stair ascent/descent 3 laps reciprocal pattern- not today SLS on airex: 3x 10 seconds (NT) Lateral stepping along parallel bar 4 laps, blue band around thighs- not today Alternating marches: 2x15 ea- forward progression in // bars (NT)  Manual Therapy: not today STM R posterior hip mm- glute med/max/piriformis/hamstring, used TB green roller too Manual QL stretch 1 min x 2 Hip belt mob: lat distraction, inf glide- Gr II/III- not today  Ended with moist heat for pain control x 10 min today (unbilled) (NT)  PATIENT EDUCATION:  Education details: PT POC/goals, hip anatomy/function, role of labrum, discussed activity modification/pacing Person educated: Patient and Spouse Education method: Explanation Education comprehension: verbalized understanding and needs further education  HOME EXERCISE PROGRAM: Access Code: [email or web address] URL: [email or web address] Date: 01/24/2024 Prepared by: Vernell Reges  Exercises - Hooklying Isometric Hip External Rotation  - 2 x daily - 7 x weekly - 3 sets - 5 reps - Supine Hip Abduction  - 2 x daily - 7 x weekly - 3 sets - 5 reps - Supine Posterior Pelvic Tilt   - 2 x daily - 7 x weekly - 3 sets - 15 reps - Supine Bridge  - 2 x daily - 7 x weekly - 3 sets - 5 reps   ASSESSMENT:  CLINICAL IMPRESSION: Patient is a 79 y.o. F who was seen today for physical therapy treatment for R hip labral tear and proximal hamstring tendon strain.  She is making progress towards goals.  Pain is improving. R hip PROM is painfree today.  Motivated to start PT session today, arrived early. Became fatigued after NuStep and Lateral Step-Ups, required blood pressure and oxygen saturation check (WNL). Opted to perform more therex in sitting. Pt noted her blood sugar levels may be reduced today and opted to end the session early. Recommend continued PT as she is still limited in her standing/walking tolerance for activities at home and the community while grocery shopping.  Discussed benefit of continuing with PT for strengthening as this will facilitate optimal long term recovery.  Would benefit from continued skilled PT to address impairments and facilitate return to PLOF.  OBJECTIVE IMPAIRMENTS: Abnormal gait, decreased activity tolerance, decreased balance, decreased ROM, decreased strength, impaired perceived functional ability, and pain.   ACTIVITY LIMITATIONS: lifting, bending, sitting, standing, squatting, stairs, transfers, bathing, locomotion level, and caring for others  PARTICIPATION LIMITATIONS: meal prep, cleaning, laundry, driving, community activity, and yard work  PERSONAL FACTORS: Age, Past/current experiences, and Time since onset of injury/illness/exacerbation are also affecting patient's functional outcome.   REHAB POTENTIAL: Excellent  CLINICAL DECISION MAKING: Evolving/moderate complexity  EVALUATION COMPLEXITY: Moderate   GOALS: Goals  reviewed with patient? Yes  SHORT TERM GOALS: Target date: 01/31/24 Pt will be instructed on a HEP for R and L LE strengthening 4-5x/week Baseline: initiated Goal status: Met   LONG TERM GOALS: Target date:  03/11/23  Improve R SLR (hamstring flexibility >10 deg) to facilitate improved ability to perform daily activities without being limited by R hip pain Baseline: L 85, R 65 deg; 02/21/24 R 70 deg Goal status: In progress  2.  Improve R hip abdstrength >1/2 MMT grade to facilitate improved standing and walking tolerance without being limited by R hip pain Baseline: 3+/5 R hip abd, 02/21/24 3+/5 Goal status: In progress  3.  Improve LEFS >10 points indicating pt significantly less limited in her daily activities by R hip pain/weakness Baseline: 45/80 Goal status: In progress   PLAN:  PT FREQUENCY: 2x/week  PT DURATION: 8 weeks  PLANNED INTERVENTIONS: 97110-Therapeutic exercises, 97530- Therapeutic activity, [id number]- Neuromuscular re-education, 97535- Self Care, [id number]- Manual therapy, and Patient/Family education  PLAN FOR NEXT SESSION: continue with hip strengthening- emphasis on gluteal mm.  Progress lateral step up/downs.  Next week discuss how to begin/progress with a regular walking program for exercise again.  Vernell Reges, PT, DPT, OCS   Vernell FORBES Reges, PT 03/08/2024, 11:30 AM  "

## 2024-03-10 ENCOUNTER — Ambulatory Visit

## 2024-03-10 DIAGNOSIS — M25551 Pain in right hip: Secondary | ICD-10-CM | POA: Diagnosis not present

## 2024-03-10 DIAGNOSIS — R29898 Other symptoms and signs involving the musculoskeletal system: Secondary | ICD-10-CM

## 2024-03-10 DIAGNOSIS — S73191D Other sprain of right hip, subsequent encounter: Secondary | ICD-10-CM

## 2024-03-10 NOTE — Therapy (Signed)
 " OUTPATIENT PHYSICAL THERAPY LOWER EXTREMITY TREATMENT/Progress Note/Re-certification through 04/07/24   Patient Name: Rhonda Lamb MRN: [id number] DOB:05-11-1945, 79 y.o., female Today's Date: 03/10/2024  END OF SESSION:  PT End of Session - 03/10/24 1025     Visit Number 14    Number of Visits 22    Date for Recertification  04/07/24    Authorization Type 2x/week x 4 weeks    PT Start Time 1015    PT Stop Time 1100    PT Time Calculation (min) 45 min    Activity Tolerance Patient limited by fatigue                    Past Medical History:  Diagnosis Date   Motion sickness    circular motion   Vertigo    none in over 1 year   Past Surgical History:  Procedure Laterality Date   COLONOSCOPY WITH PROPOFOL  N/A 06/06/2020   Procedure: COLONOSCOPY WITH PROPOFOL ;  Surgeon: Jinny Carmine, MD;  Location: [facility];  Service: Endoscopy;  Laterality: N/A;  priority 4   WISDOM TOOTH EXTRACTION     Patient Active Problem List   Diagnosis Date Noted   Chronic right hip pain 02/21/2024   Osteoarthritis 08/20/2023   Chronic cough 02/18/2023   Prediabetes 11/20/2022   GAD (generalized anxiety disorder) 02/04/2022   Overweight with body mass index (BMI) of 26 to 26.9 in adult 09/24/2020   Essential hypertension 06/18/2020   Osteoporosis 12/07/2019   Mixed hyperlipidemia 11/16/2019    PCP: Angeline Laura, NP  REFERRING PROVIDER: Belvie Roers, MD  REFERRING DIAG:  Diagnosis  S73.191D (ICD-10-CM) - Tear of right acetabular labrum, subsequent encounter  D23.687J (ICD-10-CM) - Partial tear of left hamstring  S76.311A (ICD-10-CM) - Partial tear of right hamstring    THERAPY DIAG:  Pain in right hip  Weakness of right hip  Tear of right acetabular labrum, subsequent encounter  Rationale for Evaluation and Treatment: Rehabilitation  ONSET DATE: 15 days ago  SUBJECTIVE:   SUBJECTIVE STATEMENT: Pt is here with her husband [name].  MOI: 12/30/23 fall on  R outer hip when trying to get out of a hospital recliner bed while visiting her brother at [facility].  Underwent full work up in [facility].  And was admitted to hospital for tx.  No fx.  Sustained R labral tear (anterior/superior) and hamstring strain/partial tear.  C/o R achy in glute max area, R goin area and runs down into inner thigh  Sx come and go in R hip/groin  Not back to her normal activities  Now able to put weight on R LE- was not initially  CLOF: getting in and out of the shower is concerning, not doing housework, not driving, has everything on first floor she needs- has a second floor of her home- able to do if she needs to   PLOF: was driving, was independent, spending time with family, [place] she [place], getting ready for holidays, gardening/landscaping/weeding, helping her brother- does a lot of physical activities to help him   PERTINENT HISTORY: Has concerns of high BP- trying to manage this since the injury too (medication side effect)   Overall, making progress.   PAIN:  Are you having pain? 2/10  PRECAUTIONS: Fall  RED FLAGS: None   WEIGHT BEARING RESTRICTIONS: No  FALLS:  Has patient fallen in last 6 months? Yes. Number of falls 1  LIVING ENVIRONMENT: Lives with: lives with their spouse Lives in:  House/apartment Stairs: yes, few to enter and second story Has following equipment at home: Single point cane and Walker - 2 wheeled but not using  OCCUPATION: not working  PLOF: Independent  PATIENT GOALS: to be able to manage the pain and build up the strength again in her hip to resume her PLOF   NEXT MD VISIT: unknown  OBJECTIVE:  Note: Objective measures were completed at Evaluation unless otherwise noted.  DIAGNOSTIC FINDINGS: full work up in [facility] after injury MRI 12/30/23 IMPRESSION:  1.  No acute fracture.  2.  Soft tissue contusion in the subcutaneous fat of the right lateral proximal thigh.  3.  Moderate bilateral hamstring origin  tendinosis with partial tears of the bilateral hamstring tendon origins, high-grade partial on the left.  4.  Mild bilateral gluteal tendinosis.  5.  Complex tear of the right anterior superior and superior acetabular labrum with intermediate grade juxta labral chondrosis and subchondral cystic changes in the acetabulum. No substantial right hip joint effusion.  6.  Moderate left SI joint degenerative changes with mild subchondral marrow edema.   PATIENT SURVEYS:  LEFS: to be collected  COGNITION: Overall cognitive status: Within functional limits for tasks assessed     SENSATION: WFL   POSTURE:  Hamstring flexibility with SLR: R 65, R 85 deg  PALPATION:  TTP    LOWER EXTREMITY ROM:  Active ROM Right eval Left eval  Hip flexion 105 pain 110  Hip extension    Hip abduction 25 40  Hip adduction    Hip internal rotation 20 20  Hip external rotation 20 30  Knee flexion    Knee extension    Ankle dorsiflexion    Ankle plantarflexion    Ankle inversion    Ankle eversion     (Blank rows = not tested)  LOWER EXTREMITY MMT:  MMT Right eval Left eval  Hip flexion 3+/5 pain 5  Hip extension    Hip abduction 3+/5 pain 4  Hip adduction    Hip internal rotation 4 5  Hip external rotation 4 5  Knee flexion 4 4  Knee extension    Ankle dorsiflexion    Ankle plantarflexion    Ankle inversion    Ankle eversion     (Blank rows = not tested)  LOWER EXTREMITY SPECIAL TESTS:    FUNCTIONAL TESTS:  5 times sit to stand: 15 sec  GAIT: Distance walked: into clinic without AD, + antalgic gait on R noted                                                                                                                               TREATMENT DATE: 03/10/24  Subjective: Pt did feeling better upon arrival today than she did last time.  She is having some R hip tightness, no pain this morning.     Objective: Progress Note/re-certification today/updated goals (see below) LEFS:  54/80 (was 45/80 at initial eval) R  SLR 65 deg, R hip flex AAROM: 110 not painful R hip abd: 3+/5, L 4-/5, R hip flex 4-/5 uncomfortable, L 4/5   Therapeutic Exercise: Nustep x 9 min- level 3, seat #10 (for hip ROM and strength), no arms today, .39 miles today   R hip abd x 5 with PT tactile cues (manual assist for first set), 3 sets (not today) MRE: isometric abd in hooklying x 10 (sitting today) MRE: isometric add in hooklying x10 (sitting today)  PROM R hip: to assess today, pain free for all flexion, ER, IR motions today.  For re-assessment Seated heel slides (hamstring curl): 1x15, 1x10 (GTB)- not today  Therapeutic Activities:  Hip abd: standing unilateral stance; 0# x10, 2# 2x10 R and L Hip extension: 2x10 2# ankle weights (not today) Squats with elevated mat table behind her- performed as sit to stand today holding 5# weight  2x10 Front step up RLLR 6-inch x 10, 2LRRL 6-inch x10 Lateral step up R: x10 Marches: 10x ea LE alternating 2# ankle weights  Stair ascent/descent 3 laps reciprocal pattern- not today SLS on airex: 6x 10 seconds, practiced R and L side  Lateral stepping along parallel bar 4 laps, blue band around thighs- not today Alternating marches: 2x15 ea- forward progression in // bars (NT)  Manual Therapy: not today STM R posterior hip mm- glute med/max/piriformis/hamstring, used TB green roller too Manual QL stretch 1 min x 2 Hip belt mob: lat distraction, inf glide- Gr II/III- not today  Ended with moist heat for pain control x 10 min today (unbilled) not today  PATIENT EDUCATION:  Education details: PT POC/goals, hip anatomy/function, role of labrum, discussed activity modification/pacing Person educated: Patient and Spouse Education method: Explanation Education comprehension: verbalized understanding and needs further education  HOME EXERCISE PROGRAM: Access Code: [email or web address] URL: [email or web address] Date: 01/24/2024 Prepared by: Vernell Reges  Exercises - Hooklying Isometric Hip External Rotation  - 2 x daily - 7 x weekly - 3 sets - 5 reps - Supine Hip Abduction  - 2 x daily - 7 x weekly - 3 sets - 5 reps - Supine Posterior Pelvic Tilt  - 2 x daily - 7 x weekly - 3 sets - 15 reps - Supine Bridge  - 2 x daily - 7 x weekly - 3 sets - 5 reps   ASSESSMENT:  CLINICAL IMPRESSION: Patient is a 79 y.o. F who was seen today for physical therapy treatment for R hip labral tear and proximal hamstring tendon strain.  She is making progress towards goals.  LEFS score is improving.  She does still lack sufficient strength in her R hip mm to fully resume her typical walking/hiking program again.  She continues to implement activity pacing/modification during her housework and errands that require standing. Recommend continued PT as she is still limited in her standing/walking tolerance for activities at home and the community while grocery shopping.  Discussed benefit of continuing with PT for strengthening as this will facilitate optimal long term recovery.  Would benefit from continued skilled PT to address impairments and facilitate return to PLOF.  OBJECTIVE IMPAIRMENTS: Abnormal gait, decreased activity tolerance, decreased balance, decreased ROM, decreased strength, impaired perceived functional ability, and pain.   ACTIVITY LIMITATIONS: lifting, bending, sitting, standing, squatting, stairs, transfers, bathing, locomotion level, and caring for others  PARTICIPATION LIMITATIONS: meal prep, cleaning, laundry, driving, community activity, and yard work  PERSONAL FACTORS: Age, Past/current experiences, and Time since onset of injury/illness/exacerbation are also affecting patient's functional outcome.  REHAB POTENTIAL: Excellent  CLINICAL DECISION MAKING: Evolving/moderate complexity  EVALUATION COMPLEXITY: Moderate   GOALS: Goals reviewed with patient? Yes  SHORT TERM GOALS: Target date: 01/31/24 Pt will be instructed on a  HEP for R and L LE strengthening 4-5x/week Baseline: initiated Goal status: Met   LONG TERM GOALS: Target date: 04/07/24  Improve R SLR (hamstring flexibility >10 deg) to facilitate improved ability to perform daily activities without being limited by R hip pain Baseline: L 85, R 65 deg; 02/21/24 R 70 deg, 03/10/24- R 70 deg Goal status: In progress  2.  Improve R hip abdstrength >1/2 MMT grade to facilitate improved standing and walking tolerance without being limited by R hip pain Baseline: 3+/5 R hip abd, 02/21/24 3+/5; 03/10/24 R 3+/5 no pain now Goal status: In progress  3.  Improve LEFS >10 points indicating pt significantly less limited in her daily activities by R hip pain/weakness Baseline: 45/80, 03/10/24 54/80 Goal status: In progress   PLAN:  PT FREQUENCY: 2x/week  PT DURATION: 4 weeks  PLANNED INTERVENTIONS: 97110-Therapeutic exercises, 97530- Therapeutic activity, [id number]- Neuromuscular re-education, 97535- Self Care, [id number]- Manual therapy, and Patient/Family education  PLAN FOR NEXT SESSION: continue with hip strengthening- emphasis on gluteal mm.  Progress lateral step up/downs.  Next week discuss how to begin/progress with a regular walking program for exercise again.  Vernell Reges, PT, DPT, OCS   Vernell FORBES Reges, PT 03/10/2024, 10:26 AM  "

## 2024-03-15 ENCOUNTER — Ambulatory Visit

## 2024-03-17 ENCOUNTER — Ambulatory Visit: Admitting: Internal Medicine

## 2024-03-17 ENCOUNTER — Encounter: Payer: Self-pay | Admitting: Internal Medicine

## 2024-03-17 VITALS — BP 130/84 | Ht 63.5 in | Wt 152.0 lb

## 2024-03-17 DIAGNOSIS — L821 Other seborrheic keratosis: Secondary | ICD-10-CM

## 2024-03-17 DIAGNOSIS — I10 Essential (primary) hypertension: Secondary | ICD-10-CM | POA: Diagnosis not present

## 2024-03-17 DIAGNOSIS — S76312A Strain of muscle, fascia and tendon of the posterior muscle group at thigh level, left thigh, initial encounter: Secondary | ICD-10-CM

## 2024-03-17 DIAGNOSIS — F5101 Primary insomnia: Secondary | ICD-10-CM | POA: Diagnosis not present

## 2024-03-17 DIAGNOSIS — S76311A Strain of muscle, fascia and tendon of the posterior muscle group at thigh level, right thigh, initial encounter: Secondary | ICD-10-CM

## 2024-03-17 DIAGNOSIS — S73191D Other sprain of right hip, subsequent encounter: Secondary | ICD-10-CM

## 2024-03-17 MED ORDER — AMLODIPINE BESYLATE 5 MG PO TABS: 5.0000 mg | ORAL_TABLET | Freq: Every day | ORAL | 1 refills | Status: AC

## 2024-03-17 NOTE — Patient Instructions (Signed)
 Insomnia Insomnia is a sleep disorder that makes it difficult to fall asleep or stay asleep. Insomnia can cause fatigue, low energy, difficulty concentrating, mood swings, and poor performance at work or school. There are three different ways to classify insomnia: Difficulty falling asleep. Difficulty staying asleep. Waking up too early in the morning. Any type of insomnia can be long-term (chronic) or short-term (acute). Both are common. Short-term insomnia usually lasts for 3 months or less. Chronic insomnia occurs at least three times a week for longer than 3 months. What are the causes? Insomnia may be caused by another condition, situation, or substance, such as: Having certain mental health conditions, such as anxiety and depression. Using caffeine, alcohol , tobacco, or drugs. Having gastrointestinal conditions, such as gastroesophageal reflux disease (GERD). Having certain medical conditions. These include: Asthma. Alzheimer's disease. Stroke. Chronic pain. An overactive thyroid  gland (hyperthyroidism). Other sleep disorders, such as restless legs syndrome and sleep apnea. Menopause. Sometimes, the cause of insomnia may not be known. What increases the risk? Risk factors for insomnia include: Gender. Females are affected more often than males. Age. Insomnia is more common as people get older. Stress and certain medical and mental health conditions. Lack of exercise. Having an irregular work schedule. This may include working night shifts and traveling between different time zones. What are the signs or symptoms? If you have insomnia, the main symptom is having trouble falling asleep or having trouble staying asleep. This may lead to other symptoms, such as: Feeling tired or having low energy. Feeling nervous about going to sleep. Not feeling rested in the morning. Having trouble concentrating. Feeling irritable, anxious, or depressed. How is this diagnosed? This condition  may be diagnosed based on: Your symptoms and medical history. Your health care provider may ask about: Your sleep habits. Any medical conditions you have. Your mental health. A physical exam. How is this treated? Treatment for insomnia depends on the cause. Treatment may focus on treating an underlying condition that is causing the insomnia. Treatment may also include: Medicines to help you sleep. Counseling or therapy. Lifestyle adjustments to help you sleep better. Follow these instructions at home: Eating and drinking  Limit or avoid alcohol , caffeinated beverages, and products that contain nicotine and tobacco, especially close to bedtime. These can disrupt your sleep. Do not eat a large meal or eat spicy foods right before bedtime. This can lead to digestive discomfort that can make it hard for you to sleep. Sleep habits  Keep a sleep diary to help you and your health care provider figure out what could be causing your insomnia. Write down: When you sleep. When you wake up during the night. How well you sleep and how rested you feel the next day. Any side effects of medicines you are taking. What you eat and drink. Make your bedroom a dark, comfortable place where it is easy to fall asleep. Put up shades or blackout curtains to block light from outside. Use a white noise machine to block noise. Keep the temperature cool. Limit screen use before bedtime. This includes: Not watching TV. Not using your smartphone, tablet, or computer. Stick to a routine that includes going to bed and waking up at the same times every day and night. This can help you fall asleep faster. Consider making a quiet activity, such as reading, part of your nighttime routine. Try to avoid taking naps during the day so that you sleep better at night. Get out of bed if you are still awake after  15 minutes of trying to sleep. Keep the lights down, but try reading or doing a quiet activity. When you feel  sleepy, go back to bed. General instructions Take over-the-counter and prescription medicines only as told by your health care provider. Exercise regularly as told by your health care provider. However, avoid exercising in the hours right before bedtime. Use relaxation techniques to manage stress. Ask your health care provider to suggest some techniques that may work well for you. These may include: Breathing exercises. Routines to release muscle tension. Visualizing peaceful scenes. Make sure that you drive carefully. Do not drive if you feel very sleepy. Keep all follow-up visits. This is important. Contact a health care provider if: You are tired throughout the day. You have trouble in your daily routine due to sleepiness. You continue to have sleep problems, or your sleep problems get worse. Get help right away if: You have thoughts about hurting yourself or someone else. Get help right away if you feel like you may hurt yourself or others, or have thoughts about taking your own life. Go to your nearest emergency room or: Call 911. Call the National Suicide Prevention Lifeline at 2232757840 or 988. This is open 24 hours a day. Text the Crisis Text Line at 657-529-4371. Summary Insomnia is a sleep disorder that makes it difficult to fall asleep or stay asleep. Insomnia can be long-term (chronic) or short-term (acute). Treatment for insomnia depends on the cause. Treatment may focus on treating an underlying condition that is causing the insomnia. Keep a sleep diary to help you and your health care provider figure out what could be causing your insomnia. This information is not intended to replace advice given to you by your health care provider. Make sure you discuss any questions you have with your health care provider. Document Revised: 01/27/2021 Document Reviewed: 01/27/2021 Elsevier Patient Education  2024 [employer].
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
## 2024-03-20 ENCOUNTER — Other Ambulatory Visit: Payer: Self-pay | Admitting: Internal Medicine

## 2024-03-20 ENCOUNTER — Ambulatory Visit

## 2024-03-20 DIAGNOSIS — M25551 Pain in right hip: Secondary | ICD-10-CM

## 2024-03-20 DIAGNOSIS — S73191D Other sprain of right hip, subsequent encounter: Secondary | ICD-10-CM

## 2024-03-20 DIAGNOSIS — R29898 Other symptoms and signs involving the musculoskeletal system: Secondary | ICD-10-CM

## 2024-03-20 NOTE — Therapy (Unsigned)
 " OUTPATIENT PHYSICAL THERAPY LOWER EXTREMITY TREATMENT  Patient Name: Rhonda Lamb MRN: [id number] DOB:1945-10-08, 79 y.o., female Today's Date: 03/20/2024  END OF SESSION:  PT End of Session - 03/20/24 1545     Visit Number 15    Number of Visits 22    Date for Recertification  04/07/24    Authorization Type 2x/week x 4 weeks    PT Start Time 1550    PT Stop Time 1639    PT Time Calculation (min) 49 min    Activity Tolerance Patient limited by fatigue                    Past Medical History:  Diagnosis Date   Motion sickness    circular motion   Vertigo    none in over 1 year   Past Surgical History:  Procedure Laterality Date   COLONOSCOPY WITH PROPOFOL  N/A 06/06/2020   Procedure: COLONOSCOPY WITH PROPOFOL ;  Surgeon: Rhonda Carmine, MD;  Location: [facility];  Service: Endoscopy;  Laterality: N/A;  priority 4   WISDOM TOOTH EXTRACTION     Patient Active Problem List   Diagnosis Date Noted   Chronic right hip pain 02/21/2024   Osteoarthritis 08/20/2023   Chronic cough 02/18/2023   Prediabetes 11/20/2022   GAD (generalized anxiety disorder) 02/04/2022   Overweight with body mass index (BMI) of 26 to 26.9 in adult 09/24/2020   Essential hypertension 06/18/2020   Osteoporosis 12/07/2019   Mixed hyperlipidemia 11/16/2019    PCP: Rhonda Laura, NP  REFERRING PROVIDER: Belvie Roers, MD  REFERRING DIAG:  Diagnosis  S73.191D (ICD-10-CM) - Tear of right acetabular labrum, subsequent encounter  D23.687J (ICD-10-CM) - Partial tear of left hamstring  S76.311A (ICD-10-CM) - Partial tear of right hamstring    THERAPY DIAG:  Pain in right hip  Weakness of right hip  Tear of right acetabular labrum, subsequent encounter  Rationale for Evaluation and Treatment: Rehabilitation  ONSET DATE: 15 days ago  SUBJECTIVE:   SUBJECTIVE STATEMENT: Pt is here with her husband Rhonda Lamb.  MOI: 12/30/23 fall on R outer hip when trying to get out of a  hospital recliner bed while visiting her brother at [facility].  Underwent full work up in [facility].  And was admitted to hospital for tx.  No fx.  Sustained R labral tear (anterior/superior) and hamstring strain/partial tear.  C/o R achy in glute max area, R goin area and runs down into inner thigh  Sx come and go in R hip/groin  Not back to her normal activities  Now able to put weight on R LE- was not initially  CLOF: getting in and out of the shower is concerning, not doing housework, not driving, has everything on first floor she needs- has a second floor of her home- able to do if she needs to   PLOF: was driving, was independent, spending time with family, [place] she [place] to, getting ready for holidays, gardening/landscaping/weeding, helping her brother- does a lot of physical activities to help him   PERTINENT HISTORY: Has concerns of high BP- trying to manage this since the injury too (medication side effect)   Overall, making progress.   PAIN:  Are you having pain? 2/10  PRECAUTIONS: Fall  RED FLAGS: None   WEIGHT BEARING RESTRICTIONS: No  FALLS:  Has patient fallen in last 6 months? Yes. Number of falls 1  LIVING ENVIRONMENT: Lives with: lives with their spouse Lives in: House/apartment Stairs: yes, few  to enter and second story Has following equipment at home: Single point cane and Walker - 2 wheeled but not using  OCCUPATION: not working  PLOF: Independent  PATIENT GOALS: to be able to manage the pain and build up the strength again in her hip to resume her PLOF   NEXT MD VISIT: unknown  OBJECTIVE:  Note: Objective measures were completed at Evaluation unless otherwise noted.  DIAGNOSTIC FINDINGS: full work up in [facility] after injury MRI 12/30/23 IMPRESSION:  1.  No acute fracture.  2.  Soft tissue contusion in the subcutaneous fat of the right lateral proximal thigh.  3.  Moderate bilateral hamstring origin tendinosis with partial tears of the  bilateral hamstring tendon origins, high-grade partial on the left.  4.  Mild bilateral gluteal tendinosis.  5.  Complex tear of the right anterior superior and superior acetabular labrum with intermediate grade juxta labral chondrosis and subchondral cystic changes in the acetabulum. No substantial right hip joint effusion.  6.  Moderate left SI joint degenerative changes with mild subchondral marrow edema.   PATIENT SURVEYS:  LEFS: to be collected  COGNITION: Overall cognitive status: Within functional limits for tasks assessed     SENSATION: WFL   POSTURE:  Hamstring flexibility with SLR: R 65, R 85 deg  PALPATION:  TTP    LOWER EXTREMITY ROM:  Active ROM Right eval Left eval  Hip flexion 105 pain 110  Hip extension    Hip abduction 25 40  Hip adduction    Hip internal rotation 20 20  Hip external rotation 20 30  Knee flexion    Knee extension    Ankle dorsiflexion    Ankle plantarflexion    Ankle inversion    Ankle eversion     (Blank rows = not tested)  LOWER EXTREMITY MMT:  MMT Right eval Left eval  Hip flexion 3+/5 pain 5  Hip extension    Hip abduction 3+/5 pain 4  Hip adduction    Hip internal rotation 4 5  Hip external rotation 4 5  Knee flexion 4 4  Knee extension    Ankle dorsiflexion    Ankle plantarflexion    Ankle inversion    Ankle eversion     (Blank rows = not tested)  LOWER EXTREMITY SPECIAL TESTS:    FUNCTIONAL TESTS:  5 times sit to stand: 15 sec  GAIT: Distance walked: into clinic without AD, + antalgic gait on R noted                                                                                                                               TREATMENT DATE: 03/20/2024  Subjective: Pt feels stronger and reports having improved in balance activities since last treatment session. Pt reports having walked 1 mile at a local mall and is excited to return to a walking program. Pt has brought her husband along to PT for support as  she learns more  about transition into walking program and eventual discharge from therapy.    Objective: Progress Note/re-certification today/updated goals (see below) LEFS: 54/80 (was 45/80 at initial eval) R SLR 65 deg, R hip flex AAROM: 110 not painful R hip abd: 3+/5, L 4-/5, R hip flex 4-/5 uncomfortable, L 4/5   Therapeutic Exercise: Nustep x 9 min- level 3, seat #10 (for hip ROM and strength), no arms today 6-minute walk test scored at 1,295 steps (community ambulator)   Neuro-muscular Re-education  Balance exercises with airex pad and then Bosu ball: side stepping and forward step-ups with CGA at // bars. 3x10 each forward an lateral  SLS on airex 3x30 seconds (limited by pain on left knee) Cone reaching forward and lateral standing on airex pad: CGA, 20 reaches in all directions   Discussed pt goals to walk around local mall 1-mile per day an average of 4 days a week. Gave verbal instruction to pt and husband in order to safely and effectively ramp up walking dosage (10-minutes of walking followed by short rest, repeat until 30 minutes of walking or 1-mile goal is met that day.  PATIENT EDUCATION:  Education details: PT POC/goals, hip anatomy/function, role of labrum, discussed activity modification/pacing Person educated: Patient and Spouse Education method: Explanation Education comprehension: verbalized understanding and needs further education  HOME EXERCISE PROGRAM: Access Code: [email or web address] URL: [email or web address] Date: 01/24/2024 Prepared by: Vernell Reges  Exercises - Hooklying Isometric Hip External Rotation  - 2 x daily - 7 x weekly - 3 sets - 5 reps - Supine Hip Abduction  - 2 x daily - 7 x weekly - 3 sets - 5 reps - Supine Posterior Pelvic Tilt  - 2 x daily - 7 x weekly - 3 sets - 15 reps - Supine Bridge  - 2 x daily - 7 x weekly - 3 sets - 5 reps   ASSESSMENT:  CLINICAL IMPRESSION: Patient is a 79 y.o. F who was seen today for physical  therapy treatment for R hip labral tear and proximal hamstring tendon strain.  She is making progress towards goals. Pt has improved greatly in balance and strength while reducing overall pain level. Pt is aiming to begin a regular walking program and will benefit from more detailed instruction next session on building a more robust HEP in order to transition from skilled therapy to self-managed fitness. 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 Pt has been instructed to complete the preliminary walking program this week, and return next week to make sure that discharge is appropriate as she states today she would like to finish this course of PT soon.  OBJECTIVE IMPAIRMENTS: Abnormal gait, decreased activity tolerance, decreased balance, decreased ROM, decreased strength, impaired perceived functional ability, and pain.   ACTIVITY LIMITATIONS: lifting, bending, sitting, standing, squatting, stairs, transfers, bathing, locomotion level, and caring for others  PARTICIPATION LIMITATIONS: meal prep, cleaning, laundry, driving, community activity, and yard work  PERSONAL FACTORS: Age, Past/current experiences, and Time since onset of injury/illness/exacerbation are also affecting patient's functional outcome.   REHAB POTENTIAL: Excellent  CLINICAL DECISION MAKING: Evolving/moderate complexity  EVALUATION COMPLEXITY: Moderate   GOALS: Goals reviewed with patient? Yes  SHORT TERM GOALS: Target date: 01/31/24 Pt will be instructed on a HEP for R and L LE strengthening 4-5x/week Baseline: initiated Goal status: Met   LONG TERM GOALS: Target date: 04/07/24  Improve R SLR (hamstring flexibility >10 deg) to facilitate improved ability to perform daily activities without being limited by R hip pain Baseline: L 85, R 65 deg; 02/21/24 R 70 deg,  03/10/24- R 70 deg Goal status: In progress  2.  Improve R hip abdstrength >1/2 MMT grade to facilitate improved standing and walking tolerance without being limited by R hip pain Baseline: 3+/5 R hip abd, 02/21/24 3+/5; 03/10/24 R 3+/5 no pain now Goal status: In progress  3.  Improve LEFS >10 points indicating pt significantly less limited in her daily activities by R hip pain/weakness Baseline: 45/80, 03/10/24 54/80 Goal status: In progress   PLAN:  PT FREQUENCY: 2x/week  PT DURATION: 4 weeks  PLANNED INTERVENTIONS: 97110-Therapeutic exercises, 97530- Therapeutic activity, 97112- Neuromuscular re-education, 97535- Self Care, [id number]- Manual therapy, and Patient/Family education  PLAN FOR NEXT SESSION: re-assess goals, administer LEFS, prescribe final HEP, anticipate DC if she is performing her regular walking program without being limited by R hip  Vernell Reges, PT, DPT, OCS   Juliene Levine, Student-PT 03/20/2024, 5:05 PM  Student physical therapist under direct supervision of licensed physical therapist during the entirety of the session.  I have personally read, edited and approve of the note as written.  Vernell Reges, PT, DPT, OCS  "

## 2024-03-20 NOTE — Therapy (Deleted)
 " OUTPATIENT PHYSICAL THERAPY LOWER EXTREMITY TREATMENT/Progress Note/Re-certification through 04/07/24   Patient Name: Rhonda Lamb MRN: [id number] DOB:[birth year], 79 y.o., female Today's Date: 03/20/2024  END OF SESSION:  PT End of Session - 03/20/24 1545     Visit Number 15 (P)     Number of Visits 22 (P)     Date for Recertification  04/07/24 (P)     Authorization Type 2x/week x 4 weeks (P)     PT Start Time 1545 (P)     Activity Tolerance Patient limited by fatigue (P)                     Past Medical History:  Diagnosis Date   Motion sickness    circular motion   Vertigo    none in over 1 year   Past Surgical History:  Procedure Laterality Date   COLONOSCOPY WITH PROPOFOL  N/A 06/06/2020   Procedure: COLONOSCOPY WITH PROPOFOL ;  Surgeon: Jinny Carmine, MD;  Location: [facility];  Service: Endoscopy;  Laterality: N/A;  priority 4   WISDOM TOOTH EXTRACTION     Patient Active Problem List   Diagnosis Date Noted   Chronic right hip pain 02/21/2024   Osteoarthritis 08/20/2023   Chronic cough 02/18/2023   Prediabetes 11/20/2022   GAD (generalized anxiety disorder) 02/04/2022   Overweight with body mass index (BMI) of 26 to 26.9 in adult 09/24/2020   Essential hypertension 06/18/2020   Osteoporosis 12/07/2019   Mixed hyperlipidemia 11/16/2019    PCP: Angeline Laura, NP  REFERRING PROVIDER: Belvie Roers, MD  REFERRING DIAG:  Diagnosis  S73.191D (ICD-10-CM) - Tear of right acetabular labrum, subsequent encounter  D23.687J (ICD-10-CM) - Partial tear of left hamstring  S76.311A (ICD-10-CM) - Partial tear of right hamstring    THERAPY DIAG:  Pain in right hip  Weakness of right hip  Tear of right acetabular labrum, subsequent encounter  Rationale for Evaluation and Treatment: Rehabilitation  ONSET DATE: 15 days ago  SUBJECTIVE:   SUBJECTIVE STATEMENT: Pt is here with her husband [name].  MOI: 12/30/23 fall on R outer hip when trying to  get out of a hospital recliner bed while visiting her brother at [facility].  Underwent full work up in [employer].  And was admitted to hospital for tx.  No fx.  Sustained R labral tear (anterior/superior) and hamstring strain/partial tear.  C/o R achy in glute max area, R goin area and runs down into inner thigh  Sx come and go in R hip/groin  Not back to her normal activities  Now able to put weight on R LE- was not initially  CLOF: getting in and out of the shower is concerning, not doing housework, not driving, has everything on first floor she needs- has a second floor of her home- able to do if she needs to   PLOF: was driving, was independent, spending time with family, [place] she [place], getting ready for holidays, gardening/landscaping/weeding, helping her brother- does a lot of physical activities to help him   PERTINENT HISTORY: Has concerns of high BP- trying to manage this since the injury too (medication side effect)   Overall, making progress.   PAIN:  Are you having pain? 2/10  PRECAUTIONS: Fall  RED FLAGS: None   WEIGHT BEARING RESTRICTIONS: No  FALLS:  Has patient fallen in last 6 months? Yes. Number of falls 1  LIVING ENVIRONMENT: Lives with: lives with their spouse Lives in: House/apartment Stairs: yes, few  to enter and second story Has following equipment at home: Single point cane and Walker - 2 wheeled but not using  OCCUPATION: not working  PLOF: Independent  PATIENT GOALS: to be able to manage the pain and build up the strength again in her hip to resume her PLOF   NEXT MD VISIT: unknown  OBJECTIVE:  Note: Objective measures were completed at Evaluation unless otherwise noted.  DIAGNOSTIC FINDINGS: full work up in [facility] after injury MRI 12/30/23 IMPRESSION:  1.  No acute fracture.  2.  Soft tissue contusion in the subcutaneous fat of the right lateral proximal thigh.  3.  Moderate bilateral hamstring origin tendinosis with partial  tears of the bilateral hamstring tendon origins, high-grade partial on the left.  4.  Mild bilateral gluteal tendinosis.  5.  Complex tear of the right anterior superior and superior acetabular labrum with intermediate grade juxta labral chondrosis and subchondral cystic changes in the acetabulum. No substantial right hip joint effusion.  6.  Moderate left SI joint degenerative changes with mild subchondral marrow edema.   PATIENT SURVEYS:  LEFS: to be collected  COGNITION: Overall cognitive status: Within functional limits for tasks assessed     SENSATION: WFL   POSTURE:  Hamstring flexibility with SLR: R 65, R 85 deg  PALPATION:  TTP    LOWER EXTREMITY ROM:  Active ROM Right eval Left eval  Hip flexion 105 pain 110  Hip extension    Hip abduction 25 40  Hip adduction    Hip internal rotation 20 20  Hip external rotation 20 30  Knee flexion    Knee extension    Ankle dorsiflexion    Ankle plantarflexion    Ankle inversion    Ankle eversion     (Blank rows = not tested)  LOWER EXTREMITY MMT:  MMT Right eval Left eval  Hip flexion 3+/5 pain 5  Hip extension    Hip abduction 3+/5 pain 4  Hip adduction    Hip internal rotation 4 5  Hip external rotation 4 5  Knee flexion 4 4  Knee extension    Ankle dorsiflexion    Ankle plantarflexion    Ankle inversion    Ankle eversion     (Blank rows = not tested)  LOWER EXTREMITY SPECIAL TESTS:    FUNCTIONAL TESTS:  5 times sit to stand: 15 sec  GAIT: Distance walked: into clinic without AD, + antalgic gait on R noted                                                                                                                               TREATMENT DATE: 03/20/2024  Subjective: Pt did feeling better upon arrival today than she did last time.  She is having some R hip tightness, no pain this morning.     Objective: Progress Note/re-certification today/updated goals (see below) LEFS: 54/80 (was 45/80 at  initial eval) R SLR 65 deg, R  hip flex AAROM: 110 not painful R hip abd: 3+/5, L 4-/5, R hip flex 4-/5 uncomfortable, L 4/5   Therapeutic Exercise: Nustep x 9 min- level 3, seat #10 (for hip ROM and strength), no arms today, .39 miles today   R hip abd x 5 with PT tactile cues (manual assist for first set), 3 sets (not today) MRE: isometric abd in hooklying x 10 (sitting today) MRE: isometric add in hooklying x10 (sitting today)  PROM R hip: to assess today, pain free for all flexion, ER, IR motions today.  For re-assessment Seated heel slides (hamstring curl): 1x15, 1x10 (GTB)- not today  Therapeutic Activities:  Hip abd: standing unilateral stance; 0# x10, 2# 2x10 R and L Hip extension: 2x10 2# ankle weights (not today) Squats with elevated mat table behind her- performed as sit to stand today holding 5# weight  2x10 Front step up RLLR 6-inch x 10, 2LRRL 6-inch x10 Lateral step up R: x10 Marches: 10x ea LE alternating 2# ankle weights  Stair ascent/descent 3 laps reciprocal pattern- not today SLS on airex: 6x 10 seconds, practiced R and L side  Lateral stepping along parallel bar 4 laps, blue band around thighs- not today Alternating marches: 2x15 ea- forward progression in // bars (NT)  Manual Therapy: not today STM R posterior hip mm- glute med/max/piriformis/hamstring, used TB green roller too Manual QL stretch 1 min x 2 Hip belt mob: lat distraction, inf glide- Gr II/III- not today  Ended with moist heat for pain control x 10 min today (unbilled) not today  PATIENT EDUCATION:  Education details: PT POC/goals, hip anatomy/function, role of labrum, discussed activity modification/pacing Person educated: Patient and Spouse Education method: Explanation Education comprehension: verbalized understanding and needs further education  HOME EXERCISE PROGRAM: Access Code: [email or web address] URL: [email or web address] Date: 01/24/2024 Prepared by: Vernell Reges  Exercises - Hooklying Isometric Hip External Rotation  - 2 x daily - 7 x weekly - 3 sets - 5 reps - Supine Hip Abduction  - 2 x daily - 7 x weekly - 3 sets - 5 reps - Supine Posterior Pelvic Tilt  - 2 x daily - 7 x weekly - 3 sets - 15 reps - Supine Bridge  - 2 x daily - 7 x weekly - 3 sets - 5 reps   ASSESSMENT:  CLINICAL IMPRESSION: Patient is a 79 y.o. F who was seen today for physical therapy treatment for R hip labral tear and proximal hamstring tendon strain.  She is making progress towards goals.  LEFS score is improving.  She does still lack sufficient strength in her R hip mm to fully resume her typical walking/hiking program again.  She continues to implement activity pacing/modification during her housework and errands that require standing. Recommend continued PT as she is still limited in her standing/walking tolerance for activities at home and the community while grocery shopping.  Discussed benefit of continuing with PT for strengthening as this will facilitate optimal long term recovery.  Would benefit from continued skilled PT to address impairments and facilitate return to PLOF.  OBJECTIVE IMPAIRMENTS: Abnormal gait, decreased activity tolerance, decreased balance, decreased ROM, decreased strength, impaired perceived functional ability, and pain.   ACTIVITY LIMITATIONS: lifting, bending, sitting, standing, squatting, stairs, transfers, bathing, locomotion level, and caring for others  PARTICIPATION LIMITATIONS: meal prep, cleaning, laundry, driving, community activity, and yard work  PERSONAL FACTORS: Age, Past/current experiences, and Time since onset of injury/illness/exacerbation are also affecting patient's functional outcome.   REHAB POTENTIAL: Excellent  CLINICAL DECISION MAKING: Evolving/moderate complexity  EVALUATION COMPLEXITY: Moderate   GOALS: Goals reviewed with patient? Yes  SHORT TERM GOALS: Target date: 01/31/24 Pt will be instructed on a  HEP for R and L LE strengthening 4-5x/week Baseline: initiated Goal status: Met   LONG TERM GOALS: Target date: 04/07/24  Improve R SLR (hamstring flexibility >10 deg) to facilitate improved ability to perform daily activities without being limited by R hip pain Baseline: L 85, R 65 deg; 02/21/24 R 70 deg, 03/10/24- R 70 deg Goal status: In progress  2.  Improve R hip abdstrength >1/2 MMT grade to facilitate improved standing and walking tolerance without being limited by R hip pain Baseline: 3+/5 R hip abd, 02/21/24 3+/5; 03/10/24 R 3+/5 no pain now Goal status: In progress  3.  Improve LEFS >10 points indicating pt significantly less limited in her daily activities by R hip pain/weakness Baseline: 45/80, 03/10/24 54/80 Goal status: In progress   PLAN:  PT FREQUENCY: 2x/week  PT DURATION: 4 weeks  PLANNED INTERVENTIONS: 97110-Therapeutic exercises, 97530- Therapeutic activity, [id number]- Neuromuscular re-education, 97535- Self Care, [id number]- Manual therapy, and Patient/Family education  PLAN FOR NEXT SESSION: continue with hip strengthening- emphasis on gluteal mm.  Progress lateral step up/downs.  Next week discuss how to begin/progress with a regular walking program for exercise again.  Vernell Reges, PT, DPT, OCS   Juliene Levine, Student-PT 03/20/2024, 3:46 PM  "

## 2024-03-21 NOTE — Telephone Encounter (Signed)
 Duplicate request, refilled 03/17/24.  Requested Prescriptions  Pending Prescriptions Disp Refills   amLODipine  (NORVASC ) 5 MG tablet [Pharmacy Med Name: AMLODIPINE  BESYLATE 5MG  TABLETS] 30 tablet     Sig: TAKE 1 TABLET(5 MG) BY MOUTH DAILY     Cardiovascular: Calcium  Channel Blockers 2 Passed - 03/21/2024  8:10 AM      Passed - Last BP in normal range    BP Readings from Last 1 Encounters:  03/17/24 130/84         Passed - Last Heart Rate in normal range    Pulse Readings from Last 1 Encounters:  02/21/24 77         Passed - Valid encounter within last 6 months    Recent Outpatient Visits           4 days ago Tear of right acetabular labrum, subsequent encounter   [facility] [place], Angeline ORN, NP   4 weeks ago Mixed hyperlipidemia   [facility] [place], Angeline ORN, NP   2 months ago Tear of right acetabular labrum, subsequent encounter   [facility] [place], Angeline ORN, NP   7 months ago Age-related osteoporosis without current pathological fracture   [facility] [place], Angeline ORN, [name]

## 2024-03-22 ENCOUNTER — Ambulatory Visit

## 2024-03-23 ENCOUNTER — Ambulatory Visit
Admission: RE | Admit: 2024-03-23 | Discharge: 2024-03-23 | Disposition: A | Discharge status: Home / Self Care | Source: Ambulatory Visit | Attending: Internal Medicine | Admitting: Internal Medicine

## 2024-03-23 DIAGNOSIS — M810 Age-related osteoporosis without current pathological fracture: Secondary | ICD-10-CM | POA: Insufficient documentation

## 2024-03-23 DIAGNOSIS — Z1231 Encounter for screening mammogram for malignant neoplasm of breast: Secondary | ICD-10-CM | POA: Insufficient documentation

## 2024-03-27 ENCOUNTER — Ambulatory Visit

## 2024-03-29 ENCOUNTER — Ambulatory Visit

## 2024-03-29 DIAGNOSIS — M25551 Pain in right hip: Secondary | ICD-10-CM

## 2024-03-29 DIAGNOSIS — R29898 Other symptoms and signs involving the musculoskeletal system: Secondary | ICD-10-CM

## 2024-03-29 DIAGNOSIS — S73191D Other sprain of right hip, subsequent encounter: Secondary | ICD-10-CM

## 2024-03-29 NOTE — Therapy (Addendum)
 " OUTPATIENT PHYSICAL THERAPY LOWER EXTREMITY TREATMENT/Discharge summary note  Patient Name: Rhonda Lamb MRN: [id number] DOB:11-14-1945, 79 y.o., female Today's Date: 03/29/2024  END OF SESSION:  PT End of Session - 03/29/24 1543     Visit Number 16    Number of Visits 22    Date for Recertification  04/07/24    Authorization Type 2x/week x 4 weeks    PT Start Time 1545    PT Stop Time 1622    PT Time Calculation (min) 37 min    Activity Tolerance Patient limited by fatigue                    Past Medical History:  Diagnosis Date   Motion sickness    circular motion   Vertigo    none in over 1 year   Past Surgical History:  Procedure Laterality Date   COLONOSCOPY WITH PROPOFOL  N/A 06/06/2020   Procedure: COLONOSCOPY WITH PROPOFOL ;  Surgeon: Jinny Carmine, MD;  Location: [facility];  Service: Endoscopy;  Laterality: N/A;  priority 4   WISDOM TOOTH EXTRACTION     Patient Active Problem List   Diagnosis Date Noted   Chronic right hip pain 02/21/2024   Osteoarthritis 08/20/2023   Chronic cough 02/18/2023   Prediabetes 11/20/2022   GAD (generalized anxiety disorder) 02/04/2022   Overweight with body mass index (BMI) of 26 to 26.9 in adult 09/24/2020   Essential hypertension 06/18/2020   Osteoporosis 12/07/2019   Mixed hyperlipidemia 11/16/2019    PCP: Angeline Laura, NP  REFERRING PROVIDER: Belvie Roers, MD  REFERRING DIAG:  Diagnosis  S73.191D (ICD-10-CM) - Tear of right acetabular labrum, subsequent encounter  D23.687J (ICD-10-CM) - Partial tear of left hamstring  S76.311A (ICD-10-CM) - Partial tear of right hamstring    THERAPY DIAG:  Pain in right hip  Weakness of right hip  Tear of right acetabular labrum, subsequent encounter  Rationale for Evaluation and Treatment: Rehabilitation  ONSET DATE: 15 days ago  SUBJECTIVE:   SUBJECTIVE STATEMENT: Pt is here with her husband [name].  MOI: 12/30/23 fall on R outer hip when trying  to get out of a hospital recliner bed while visiting her brother at [facility].  Underwent full work up in [employer].  And was admitted to hospital for tx.  No fx.  Sustained R labral tear (anterior/superior) and hamstring strain/partial tear.  C/o R achy in glute max area, R goin area and runs down into inner thigh  Sx come and go in R hip/groin  Not back to her normal activities  Now able to put weight on R LE- was not initially  CLOF: getting in and out of the shower is concerning, not doing housework, not driving, has everything on first floor she needs- has a second floor of her home- able to do if she needs to   PLOF: was driving, was independent, spending time with family, [place], getting ready for holidays, gardening/landscaping/weeding, helping her brother- does a lot of physical activities to help him   PERTINENT HISTORY: Has concerns of high BP- trying to manage this since the injury too (medication side effect)   Overall, making progress.   PAIN:  Are you having pain? 2/10  PRECAUTIONS: Fall  RED FLAGS: None   WEIGHT BEARING RESTRICTIONS: No  FALLS:  Has patient fallen in last 6 months? Yes. Number of falls 1  LIVING ENVIRONMENT: Lives with: lives with their spouse Lives in: House/apartment Stairs:  yes, few to enter and second story Has following equipment at home: Single point cane and Walker - 2 wheeled but not using  OCCUPATION: not working  PLOF: Independent  PATIENT GOALS: to be able to manage the pain and build up the strength again in her hip to resume her PLOF   NEXT MD VISIT: unknown  OBJECTIVE:  Note: Objective measures were completed at Evaluation unless otherwise noted.  DIAGNOSTIC FINDINGS: full work up in E.R after injury MRI 12/30/23 IMPRESSION:  1.  No acute fracture.  2.  Soft tissue contusion in the subcutaneous fat of the right lateral proximal thigh.  3.  Moderate bilateral hamstring origin tendinosis with partial  tears of the bilateral hamstring tendon origins, high-grade partial on the left.  4.  Mild bilateral gluteal tendinosis.  5.  Complex tear of the right anterior superior and superior acetabular labrum with intermediate grade juxta labral chondrosis and subchondral cystic changes in the acetabulum. No substantial right hip joint effusion.  6.  Moderate left SI joint degenerative changes with mild subchondral marrow edema.   PATIENT SURVEYS:  LEFS: to be collected  COGNITION: Overall cognitive status: Within functional limits for tasks assessed     SENSATION: WFL   POSTURE:  Hamstring flexibility with SLR: R 65, R 85 deg  PALPATION:  TTP    LOWER EXTREMITY ROM:  Active ROM Right eval Left eval  Hip flexion 105 pain 110  Hip extension    Hip abduction 25 40  Hip adduction    Hip internal rotation 20 20  Hip external rotation 20 30  Knee flexion    Knee extension    Ankle dorsiflexion    Ankle plantarflexion    Ankle inversion    Ankle eversion     (Blank rows = not tested)  LOWER EXTREMITY MMT:  MMT Right eval Left eval  Hip flexion 3+/5 pain 5  Hip extension    Hip abduction 3+/5 pain 4  Hip adduction    Hip internal rotation 4 5  Hip external rotation 4 5  Knee flexion 4 4  Knee extension    Ankle dorsiflexion    Ankle plantarflexion    Ankle inversion    Ankle eversion     (Blank rows = not tested)  LOWER EXTREMITY SPECIAL TESTS:    FUNCTIONAL TESTS:  5 times sit to stand: 15 sec  GAIT: Distance walked: into clinic without AD, + antalgic gait on R noted                                                                                                                               TREATMENT DATE: 03/29/2024  Subjective: Pt reports she has been regularly walking at the local mall and is completing her HEP as needed. Pt is interested in stopping PT today as she has reached her personal goals with this stint of therapy and has gained strength and reduced  her pain  level.   Objective: Updated objective measures today: 5x STS: 10 seconds  MMT: Right hip abduction 4/5, left hip abduction 4/5, left hip flexion 4/5, right hip flexion 4/5  Left knee flexion 5/5, right knee flexion 5/5, 5/5 hip IR/ER bilat.  LEFS: 61/80 (pt does not hop or run - subjective score) Reports no difficulty with ADLs    Therapeutic Exercise: Nustep x 10 min- level 3, seat #10 (for hip ROM and strength), no arms today Hip 3-way with 5lb ankle weights bilat. 2x10 in each plane (flexion to first step, extension with hamstring curl)  Hurdle obstacle course 4 x12 feet   PATIENT EDUCATION:  Education details: PT POC/goals, hip anatomy/function, role of labrum, discussed activity modification/pacing, reviewed walking program for her HEP Person educated: Patient and Spouse Education method: Explanation Education comprehension: verbalized understanding and needs further education  HOME EXERCISE PROGRAM: Access Code: [email or web address] URL: [email or web address] Date: 01/24/2024 Prepared by: Vernell Reges  Exercises - Hooklying Isometric Hip External Rotation  - 2 x daily - 7 x weekly - 3 sets - 5 reps - Supine Hip Abduction  - 2 x daily - 7 x weekly - 3 sets - 5 reps - Supine Posterior Pelvic Tilt  - 2 x daily - 7 x weekly - 3 sets - 15 reps - Supine Bridge  - 2 x daily - 7 x weekly - 3 sets - 5 reps   ASSESSMENT:  CLINICAL IMPRESSION: Patient is a 79 y.o. F who was seen today for physical therapy treatment for R hip labral tear and proximal hamstring tendon strain.  She has completed all of her goals in PT and is being discharged today. Pt is following a walking plan and will continue to do prescribed HEP exercises as needed. Pt has shown improvements in strength and range of motion with no reported pain now. Pt tolerance to exercise and overall endurance has improved and the pt has been educated on maintaining gains made in PT with her HEP and walking plan.    OBJECTIVE IMPAIRMENTS: Abnormal gait, decreased activity tolerance, decreased balance, decreased ROM, decreased strength, impaired perceived functional ability, and pain.   ACTIVITY LIMITATIONS: lifting, bending, sitting, standing, squatting, stairs, transfers, bathing, locomotion level, and caring for others  PARTICIPATION LIMITATIONS: meal prep, cleaning, laundry, driving, community activity, and yard work  PERSONAL FACTORS: Age, Past/current experiences, and Time since onset of injury/illness/exacerbation are also affecting patient's functional outcome.   REHAB POTENTIAL: Excellent  CLINICAL DECISION MAKING: Evolving/moderate complexity  EVALUATION COMPLEXITY: Moderate   GOALS: Goals reviewed with patient? Yes  SHORT TERM GOALS: Target date: 01/31/24 Pt will be instructed on a HEP for R and L LE strengthening 4-5x/week Baseline: initiated Goal status: Met   LONG TERM GOALS: Target date: 04/07/24  Improve R SLR (hamstring flexibility >10 deg) to facilitate improved ability to perform daily activities without being limited by R hip pain Baseline: L 85, R 65 deg; 02/21/24 R 70 deg, 03/10/24- R 70 deg, 03/29/24 R 75 deg Goal status: Met   2.  Improve R hip abdstrength >1/2 MMT grade to facilitate improved standing and walking tolerance without being limited by R hip pain Baseline: 3+/5 R hip abd, 02/21/24 3+/5; 03/10/24 R 3+/5 no pain now, 03/29/24 4/5 no pain Goal status: Met  3.  Improve LEFS >10 points indicating pt significantly less limited in her daily activities by R hip pain/weakness Baseline: 45/80, 03/10/24 54/80, 03/29/24 61/80 Goal status: Met   PLAN:  PT FREQUENCY: 2x/week  PT DURATION:  4 weeks  PLANNED INTERVENTIONS: 97110-Therapeutic exercises, 97530- Therapeutic activity, [id number]- Neuromuscular re-education, 97535- Self Care, [id number]- Manual therapy, and Patient/Family education  PLAN FOR NEXT SESSION: Pt has been discharged and will continue HEP and walking plan on  her own.   Vernell Reges, PT, DPT, OCS   Juliene Levine, Student-PT 03/29/2024, 4:44 PM  Student physical therapist under direct supervision of licensed physical therapist during the entirety of the session.  I have personally read, edited and approve of the note as written.  Vernell Reges, PT, DPT, OCS  "

## 2024-08-04 ENCOUNTER — Ambulatory Visit: Admitting: Internal Medicine

## 2024-10-13 ENCOUNTER — Ambulatory Visit

## 2024-10-25 ENCOUNTER — Ambulatory Visit
